# Patient Record
Sex: Male | Born: 1975 | Race: White | Hispanic: No | Marital: Married | Smoking: Former smoker
Health system: Southern US, Community
[De-identification: ages and names within clinical notes are randomized; demographics above are authoritative.]

## PROBLEM LIST (undated history)

## (undated) DIAGNOSIS — F909 Attention-deficit hyperactivity disorder, unspecified type: Secondary | ICD-10-CM

## (undated) DIAGNOSIS — M549 Dorsalgia, unspecified: Secondary | ICD-10-CM

## (undated) DIAGNOSIS — B2790 Infectious mononucleosis, unspecified without complication: Secondary | ICD-10-CM

---

## 2005-02-17 ENCOUNTER — Emergency Department (HOSPITAL_COMMUNITY): Admission: EM | Admit: 2005-02-17 | Discharge: 2005-02-18 | Payer: Self-pay | Admitting: Emergency Medicine

## 2011-06-08 ENCOUNTER — Emergency Department (HOSPITAL_BASED_OUTPATIENT_CLINIC_OR_DEPARTMENT_OTHER)
Admission: EM | Admit: 2011-06-08 | Discharge: 2011-06-08 | Disposition: A | Discharge status: Home / Self Care | Payer: Commercial Managed Care - PPO | Attending: Emergency Medicine | Admitting: Emergency Medicine

## 2011-06-08 ENCOUNTER — Emergency Department (INDEPENDENT_AMBULATORY_CARE_PROVIDER_SITE_OTHER): Payer: Commercial Managed Care - PPO

## 2011-06-08 ENCOUNTER — Encounter: Payer: Self-pay | Admitting: *Deleted

## 2011-06-08 DIAGNOSIS — R062 Wheezing: Secondary | ICD-10-CM

## 2011-06-08 DIAGNOSIS — R0602 Shortness of breath: Secondary | ICD-10-CM

## 2011-06-08 DIAGNOSIS — R059 Cough, unspecified: Secondary | ICD-10-CM | POA: Insufficient documentation

## 2011-06-08 DIAGNOSIS — J111 Influenza due to unidentified influenza virus with other respiratory manifestations: Secondary | ICD-10-CM | POA: Insufficient documentation

## 2011-06-08 DIAGNOSIS — R05 Cough: Secondary | ICD-10-CM

## 2011-06-08 DIAGNOSIS — J029 Acute pharyngitis, unspecified: Secondary | ICD-10-CM

## 2011-06-08 DIAGNOSIS — J9801 Acute bronchospasm: Secondary | ICD-10-CM

## 2011-06-08 LAB — URINALYSIS, ROUTINE W REFLEX MICROSCOPIC
Bilirubin Urine: NEGATIVE
Glucose, UA: NEGATIVE mg/dL
Ketones, ur: NEGATIVE mg/dL
Leukocytes, UA: NEGATIVE
Specific Gravity, Urine: 1.007 (ref 1.005–1.030)
pH: 6.5 (ref 5.0–8.0)

## 2011-06-08 LAB — DIFFERENTIAL
Basophils Absolute: 0 10*3/uL (ref 0.0–0.1)
Basophils Relative: 0 % (ref 0–1)
Eosinophils Absolute: 0 10*3/uL (ref 0.0–0.7)
Neutro Abs: 6.7 10*3/uL (ref 1.7–7.7)
Neutrophils Relative %: 79 % — ABNORMAL HIGH (ref 43–77)

## 2011-06-08 LAB — COMPREHENSIVE METABOLIC PANEL
AST: 25 U/L (ref 0–37)
Albumin: 4 g/dL (ref 3.5–5.2)
Alkaline Phosphatase: 63 U/L (ref 39–117)
Chloride: 102 mEq/L (ref 96–112)
Potassium: 3.5 mEq/L (ref 3.5–5.1)
Sodium: 137 mEq/L (ref 135–145)
Total Bilirubin: 0.9 mg/dL (ref 0.3–1.2)
Total Protein: 7.5 g/dL (ref 6.0–8.3)

## 2011-06-08 LAB — CBC
Hemoglobin: 14.2 g/dL (ref 13.0–17.0)
MCHC: 36.7 g/dL — ABNORMAL HIGH (ref 30.0–36.0)
Platelets: 181 10*3/uL (ref 150–400)
RDW: 12.9 % (ref 11.5–15.5)

## 2011-06-08 MED ORDER — SODIUM CHLORIDE 0.9 % IV BOLUS (SEPSIS)
2000.0000 mL | Freq: Once | INTRAVENOUS | Status: AC
  Administered 2011-06-08: 2000 mL via INTRAVENOUS

## 2011-06-08 MED ORDER — ALBUTEROL SULFATE HFA 108 (90 BASE) MCG/ACT IN AERS: 2.0000 | INHALATION_SPRAY | RESPIRATORY_TRACT | Status: DC | PRN

## 2011-06-08 MED ORDER — PREDNISONE 20 MG PO TABS: ORAL_TABLET | ORAL | Status: AC

## 2011-06-08 MED ORDER — PREDNISONE 50 MG PO TABS
60.0000 mg | ORAL_TABLET | Freq: Once | ORAL | Status: AC
  Administered 2011-06-08: 60 mg via ORAL
  Filled 2011-06-08: qty 1

## 2011-06-08 MED ORDER — ALBUTEROL SULFATE (5 MG/ML) 0.5% IN NEBU
5.0000 mg | INHALATION_SOLUTION | Freq: Once | RESPIRATORY_TRACT | Status: AC
  Administered 2011-06-08: 5 mg via RESPIRATORY_TRACT
  Filled 2011-06-08: qty 1

## 2011-06-08 MED ORDER — IPRATROPIUM BROMIDE 0.02 % IN SOLN
0.5000 mg | Freq: Once | RESPIRATORY_TRACT | Status: AC
  Administered 2011-06-08: 0.5 mg via RESPIRATORY_TRACT
  Filled 2011-06-08: qty 2.5

## 2011-06-08 MED ORDER — SODIUM CHLORIDE 0.9 % IV SOLN: INTRAVENOUS | Status: DC

## 2011-06-08 NOTE — ED Notes (Signed)
Pt states he has had flu-like s/s since Wed. (fever, cough, body soreness, weakness, diarrhea and bloody mucous since last Wed.) No relief with OTC meds.

## 2011-06-08 NOTE — ED Provider Notes (Signed)
History  Scribed for Hurman Horn, MD, the patient was seen in room MH10/MH10. This chart was scribed by Candelaria Stagers. The patient's care started at 4:26PM.     CSN: [id number] Arrival date & time: 06/08/2011  4:15 PM   First MD Initiated Contact with Patient 06/08/11 1624      Chief Complaint  Patient presents with  . Influenza     The history is provided by the patient.   Hector Williamson is a 35 y.o. male who presents to the Emergency Department complaining of influenza sx that began about four days ago.  Pt is experiencing fever, cough, body chills, generalized weakness, headache, SOB, and loss of appetite.  He denies nausea, vomiting, rash, and confusion.  He has taken OTC medications for sx with no relief.  He has no h/o asthma.    History reviewed. No pertinent past medical history.  History reviewed. No pertinent past surgical history.  History reviewed. No pertinent family history.  History  Substance Use Topics  . Smoking status: Never Smoker   . Smokeless tobacco: Not on file  . Alcohol Use: Yes      Review of Systems  Constitutional: Positive for fever, chills, activity change and appetite change.       10 Systems reviewed and are negative for acute change except as noted in the HPI.  HENT: Negative for congestion.   Eyes: Negative for discharge and redness.  Respiratory: Positive for cough and shortness of breath.   Cardiovascular: Negative for chest pain.  Gastrointestinal: Negative for nausea, vomiting and abdominal pain.  Musculoskeletal: Negative for back pain.  Skin: Negative for rash.  Neurological: Positive for weakness. Negative for syncope, numbness and headaches.  Psychiatric/Behavioral: Negative for confusion.       No behavior change.    Allergies  Penicillins  Home Medications   Current Outpatient Rx  Name Route Sig Dispense Refill  . ACETAMINOPHEN 500 MG PO TABS Oral Take 1,000 mg by mouth every 6 (six) hours as needed. For pain      . DM-DOXYLAMINE-ACETAMINOPHEN 15-6.25-325 MG PO CAPS Oral Take 2 capsules by mouth at bedtime as needed. For cold and flu symptoms     . DM-PHENYLEPHRINE-ACETAMINOPHEN 10-5-325 MG PO CAPS Oral Take 2 capsules by mouth 2 (two) times daily as needed. For cold and flu symptoms      . ALBUTEROL SULFATE HFA 108 (90 BASE) MCG/ACT IN AERS Inhalation Inhale 2 puffs into the lungs every 2 (two) hours as needed for wheezing or shortness of breath (cough). 1 Inhaler 0  . PREDNISONE 20 MG PO TABS  2 tabs po daily x 4 days 8 tablet 0    BP 115/73  Pulse 112  Temp(Src) 100.4 F (38 C) (Oral)  Resp 20  Ht 6' (1.829 m)  Wt 185 lb (83.915 kg)  BMI 25.09 kg/m2  SpO2 100%  Physical Exam  Nursing note and vitals reviewed. Constitutional: He is oriented to person, place, and time. He appears well-developed and well-nourished. No distress.       Awake, alert, nontoxic appearance.  HENT:  Head: Atraumatic.  Right Ear: Tympanic membrane and external ear normal.  Left Ear: Tympanic membrane and external ear normal.  Mouth/Throat: Oropharyngeal exudate present.       Tonsillar mildly enlarged, with scattered exudates, mildly erythemas.   Oropharynx somewhat dry.   Eyes: Conjunctivae are normal. Pupils are equal, round, and reactive to light. Right eye exhibits no discharge. Left eye exhibits no discharge.  Neck: Normal range of motion. Neck supple.  Cardiovascular: Regular rhythm.  Exam reveals no gallop and no friction rub.   No murmur heard.      Tachycardic    Pulmonary/Chest: Effort normal. He has wheezes (mild expiratory wheezes). He has no rales. He exhibits tenderness.       Scattered rhonchi bilaterally.    Abdominal: Soft. He exhibits no mass. There is tenderness (mild difuse tenderness). There is no rebound.       No HSM palpated.   Musculoskeletal: Normal range of motion. He exhibits tenderness. Edema: diffusly tender arms, legs, and back.        Baseline ROM, no obvious new focal  weakness.  Neurological: He is alert and oriented to person, place, and time.       Mental status and motor strength appears baseline for patient and situation.  Skin: Skin is warm and dry. No rash noted.  Psychiatric: He has a normal mood and affect. His behavior is normal.    ED Course  Procedures   DIAGNOSTIC STUDIES: Oxygen Saturation is 100% on room air, normal by my interpretation.    COORDINATION OF CARE:  4:38PM Ordered: Urinalysis, Routine w reflex microscopic ; Rapid strep screen ; DG Chest 2 View ; albuterol (PROVENTIL) (5 MG/ML) 0.5% nebulizer solution 5 mg ; ipratropium (ATROVENT) nebulizer solution 0.5 mg ; predniSONE (DELTASONE) tablet 60 mg ; sodium chloride 0.9 % bolus 2,000 mL ; 0.9 % sodium chloride infusion ; Pulse oximetry, continuous  6:42PM Recheck: Discussed course of care with pt. the patient feels much better after IV fluids in the emergency department and feels ready for home.  Labs Reviewed  CBC - Abnormal; Notable for the following:    HCT 38.7 (*)    MCHC 36.7 (*)    All other components within normal limits  DIFFERENTIAL - Abnormal; Notable for the following:    Neutrophils Relative 79 (*)    All other components within normal limits  COMPREHENSIVE METABOLIC PANEL - Abnormal; Notable for the following:    Glucose, Bld 130 (*)    All other components within normal limits  LIPASE, BLOOD  URINALYSIS, ROUTINE W REFLEX MICROSCOPIC  RAPID STREP SCREEN   Dg Chest 2 View  06/08/2011  *RADIOLOGY REPORT*  Clinical Data: Cough, fever, wheezing.  Sore throat, shortness of breath.  Positive strep.  CHEST - 2 VIEW  Comparison: None.  Findings: The film is made with shallow lung inflation.  Heart size is normal.  There is perihilar peribronchial thickening.  There are no focal consolidations or pleural effusions.  No evidence for pulmonary edema.  IMPRESSION: Changes consistent with viral airways disease.  Original Report Authenticated By: Patterson Hammersmith, M.D.       1. Influenza   2. Bronchospasm       MDM  I doubt any other EMC precluding discharge at this time including, but not necessarily limited to the following:SBI.  I personally performed the services described in this documentation, which was scribed in my presence. The recorded information has been reviewed and considered.         Hurman Horn, MD 06/08/11 2232

## 2014-11-16 ENCOUNTER — Inpatient Hospital Stay (HOSPITAL_COMMUNITY)
Admission: EM | Admit: 2014-11-16 | Discharge: 2014-11-19 | DRG: 872 | Disposition: A | Discharge status: Home / Self Care | Payer: BLUE CROSS/BLUE SHIELD | Attending: Internal Medicine | Admitting: Internal Medicine

## 2014-11-16 ENCOUNTER — Emergency Department (HOSPITAL_COMMUNITY): Payer: BLUE CROSS/BLUE SHIELD

## 2014-11-16 ENCOUNTER — Encounter (HOSPITAL_COMMUNITY): Payer: Self-pay | Admitting: Emergency Medicine

## 2014-11-16 DIAGNOSIS — M549 Dorsalgia, unspecified: Secondary | ICD-10-CM | POA: Diagnosis not present

## 2014-11-16 DIAGNOSIS — R6889 Other general symptoms and signs: Secondary | ICD-10-CM

## 2014-11-16 DIAGNOSIS — G8929 Other chronic pain: Secondary | ICD-10-CM | POA: Diagnosis present

## 2014-11-16 DIAGNOSIS — Z87891 Personal history of nicotine dependence: Secondary | ICD-10-CM

## 2014-11-16 DIAGNOSIS — R109 Unspecified abdominal pain: Secondary | ICD-10-CM

## 2014-11-16 DIAGNOSIS — E872 Acidosis: Secondary | ICD-10-CM | POA: Diagnosis present

## 2014-11-16 DIAGNOSIS — G039 Meningitis, unspecified: Secondary | ICD-10-CM | POA: Diagnosis present

## 2014-11-16 DIAGNOSIS — A419 Sepsis, unspecified organism: Principal | ICD-10-CM | POA: Diagnosis present

## 2014-11-16 DIAGNOSIS — F909 Attention-deficit hyperactivity disorder, unspecified type: Secondary | ICD-10-CM | POA: Diagnosis present

## 2014-11-16 DIAGNOSIS — E860 Dehydration: Secondary | ICD-10-CM | POA: Diagnosis present

## 2014-11-16 DIAGNOSIS — R29818 Other symptoms and signs involving the nervous system: Secondary | ICD-10-CM | POA: Diagnosis present

## 2014-11-16 DIAGNOSIS — R52 Pain, unspecified: Secondary | ICD-10-CM

## 2014-11-16 DIAGNOSIS — J069 Acute upper respiratory infection, unspecified: Secondary | ICD-10-CM | POA: Diagnosis present

## 2014-11-16 DIAGNOSIS — B349 Viral infection, unspecified: Secondary | ICD-10-CM | POA: Diagnosis present

## 2014-11-16 DIAGNOSIS — R509 Fever, unspecified: Secondary | ICD-10-CM | POA: Insufficient documentation

## 2014-11-16 DIAGNOSIS — M546 Pain in thoracic spine: Secondary | ICD-10-CM

## 2014-11-16 DIAGNOSIS — G0491 Myelitis, unspecified: Secondary | ICD-10-CM | POA: Diagnosis present

## 2014-11-16 DIAGNOSIS — Z79899 Other long term (current) drug therapy: Secondary | ICD-10-CM

## 2014-11-16 HISTORY — DX: Dorsalgia, unspecified: M54.9

## 2014-11-16 LAB — I-STAT CG4 LACTIC ACID, ED: LACTIC ACID, VENOUS: 3.7 mmol/L — AB (ref 0.5–2.0)

## 2014-11-16 LAB — COMPREHENSIVE METABOLIC PANEL
ALK PHOS: 60 U/L (ref 38–126)
ALT: 30 U/L (ref 17–63)
ANION GAP: 12 (ref 5–15)
AST: 33 U/L (ref 15–41)
Albumin: 4.5 g/dL (ref 3.5–5.0)
BILIRUBIN TOTAL: 1.6 mg/dL — AB (ref 0.3–1.2)
BUN: 20 mg/dL (ref 6–20)
CALCIUM: 9.6 mg/dL (ref 8.9–10.3)
CO2: 23 mmol/L (ref 22–32)
CREATININE: 1.16 mg/dL (ref 0.61–1.24)
Chloride: 102 mmol/L (ref 101–111)
GFR calc Af Amer: >60 mL/min (ref >60)
GFR calc non Af Amer: >60 mL/min (ref >60)
GLUCOSE: 102 mg/dL — AB (ref 65–99)
POTASSIUM: 4 mmol/L (ref 3.5–5.1)
SODIUM: 137 mmol/L (ref 135–145)
TOTAL PROTEIN: 7.6 g/dL (ref 6.5–8.1)

## 2014-11-16 LAB — CBC WITH DIFFERENTIAL/PLATELET
BASOS PCT: 0 % (ref 0–1)
Basophils Absolute: 0 10*3/uL (ref 0.0–0.1)
EOS ABS: 0 10*3/uL (ref 0.0–0.7)
EOS PCT: 0 % (ref 0–5)
HEMATOCRIT: 40.2 % (ref 39.0–52.0)
Hemoglobin: 14.2 g/dL (ref 13.0–17.0)
LYMPHS ABS: 0.6 10*3/uL — AB (ref 0.7–4.0)
LYMPHS PCT: 5 % — AB (ref 12–46)
MCH: 30.8 pg (ref 26.0–34.0)
MCHC: 35.3 g/dL (ref 30.0–36.0)
MCV: 87.2 fL (ref 78.0–100.0)
Monocytes Absolute: 0.9 10*3/uL (ref 0.1–1.0)
Monocytes Relative: 7 % (ref 3–12)
NEUTROS ABS: 10.7 10*3/uL — AB (ref 1.7–7.7)
NEUTROS PCT: 88 % — AB (ref 43–77)
PLATELETS: 189 10*3/uL (ref 150–400)
RBC: 4.61 MIL/uL (ref 4.22–5.81)
RDW: 13.1 % (ref 11.5–15.5)
WBC: 12.2 10*3/uL — AB (ref 4.0–10.5)

## 2014-11-16 LAB — GLUCOSE, CSF: Glucose, CSF: 60 mg/dL (ref 40–70)

## 2014-11-16 LAB — GRAM STAIN

## 2014-11-16 LAB — URINALYSIS, ROUTINE W REFLEX MICROSCOPIC
BILIRUBIN URINE: NEGATIVE
GLUCOSE, UA: NEGATIVE mg/dL
HGB URINE DIPSTICK: NEGATIVE
KETONES UR: NEGATIVE mg/dL
Leukocytes, UA: NEGATIVE
NITRITE: NEGATIVE
Protein, ur: NEGATIVE mg/dL
SPECIFIC GRAVITY, URINE: 1.023 (ref 1.005–1.030)
Urobilinogen, UA: 0.2 mg/dL (ref 0.0–1.0)
pH: 8 (ref 5.0–8.0)

## 2014-11-16 LAB — CSF CELL COUNT WITH DIFFERENTIAL
RBC Count, CSF: 1 /mm3 — ABNORMAL HIGH
RBC Count, CSF: 172 /mm3 — ABNORMAL HIGH
TUBE #: 4
Tube #: 1
WBC CSF: 1 /mm3 (ref 0–5)
WBC, CSF: 2 /mm3 (ref 0–5)

## 2014-11-16 LAB — PROTEIN, CSF: TOTAL PROTEIN, CSF: 24 mg/dL (ref 15–45)

## 2014-11-16 MED ORDER — FENTANYL CITRATE (PF) 100 MCG/2ML IJ SOLN
100.0000 ug | INTRAMUSCULAR | Status: AC | PRN
  Administered 2014-11-16 (×4): 100 ug via INTRAVENOUS
  Filled 2014-11-16 (×4): qty 2

## 2014-11-16 MED ORDER — ACETAMINOPHEN 325 MG PO TABS
650.0000 mg | ORAL_TABLET | Freq: Once | ORAL | Status: AC
  Administered 2014-11-16: 650 mg via ORAL
  Filled 2014-11-16: qty 2

## 2014-11-16 MED ORDER — ONDANSETRON HCL 4 MG/2ML IJ SOLN
4.0000 mg | Freq: Once | INTRAMUSCULAR | Status: AC
  Administered 2014-11-16: 4 mg via INTRAVENOUS
  Filled 2014-11-16: qty 2

## 2014-11-16 MED ORDER — DEXTROSE 5 % IV SOLN
2.0000 g | Freq: Once | INTRAVENOUS | Status: AC
  Administered 2014-11-16: 2 g via INTRAVENOUS
  Filled 2014-11-16: qty 2

## 2014-11-16 MED ORDER — MEPERIDINE HCL 50 MG/ML IJ SOLN
25.0000 mg | Freq: Once | INTRAMUSCULAR | Status: AC
Start: 2014-11-17 — End: 2014-11-17
  Administered 2014-11-17: 25 mg via INTRAVENOUS

## 2014-11-16 MED ORDER — SODIUM CHLORIDE 0.9 % IV BOLUS (SEPSIS)
2500.0000 mL | Freq: Once | INTRAVENOUS | Status: AC
  Administered 2014-11-16: 2500 mL via INTRAVENOUS

## 2014-11-16 NOTE — ED Notes (Signed)
Lumbar puncture tray at bedside. °

## 2014-11-16 NOTE — ED Provider Notes (Signed)
2115:  I was asked by Dr. Effie ShyWentz to perform a lumbar puncture on the patient.  LUMBAR PUNCTURE Date/Time: 11/16/2014 10:06 PM Performed by: Roxy HorsemanBROWNING, Peyten Punches Authorized by: Mancel BaleWENTZ, ELLIOTT Consent: The procedure was performed in an emergent situation. Verbal consent obtained. Written consent obtained. Risks and benefits: risks, benefits and alternatives were discussed Consent given by: patient and spouse Patient understanding: patient states understanding of the procedure being performed Patient consent: the patient's understanding of the procedure matches consent given Procedure consent: procedure consent matches procedure scheduled Relevant documents: relevant documents present and verified Test results: test results available and properly labeled Site marked: the operative site was marked Imaging studies: imaging studies available Required items: required blood products, implants, devices, and special equipment available Patient identity confirmed: verbally with patient, arm band, provided demographic data and hospital-assigned identification number Time out: Immediately prior to procedure a "time out" was called to verify the correct patient, procedure, equipment, support staff and site/side marked as required. Indications: evaluation for infection Anesthesia: local infiltration Local anesthetic: lidocaine 1% without epinephrine Anesthetic total: 5 ml Patient sedated: no Preparation: Patient was prepped and draped in the usual sterile fashion. Lumbar space: L4-L5 interspace Patient's position: sitting Needle gauge: 18 Needle type: spinal needle - Quincke tip Needle length: 3.5 in Number of attempts: 1 Fluid appearance: clear Tubes of fluid: 4 Total volume: 2 ml Post-procedure: site cleaned, pressure dressing applied and adhesive bandage applied Patient tolerance: Patient tolerated the procedure well with no immediate complications Comments: Lumbar puncture was successful after 1st  attempt, patient tolerated the procedure well with no immediate complications.  CSF was clear in all tubes.  I suspect that there will be some RBCs in initial tube 2/2 to my entering the skin near the anesthetized region which still had scant amount of bleeding after being wiped clean.  Roxy Horsemanobert Johnnie Moten, PA-C 11/16/14 2211  Mancel BaleElliott Wentz, MD 11/16/14 2325

## 2014-11-16 NOTE — ED Notes (Signed)
[facility] presents with a 39 yo male from home with back pain/spasms that started 1 hour ago per patient.  No injury sustained; pt has hx of back spasms but stated not this severe.  Pt has 10 out of 10 pain at this time.  Pt was anxious with [facility] breathing at approximately 60 bpm and tachycardic in the 110 to 120  bpm range.  Patient is significantly more calm at this time but remains tachycardic at 119 bpm.

## 2014-11-16 NOTE — ED Notes (Signed)
Bed: WA21 Expected date:  Expected time:  Means of arrival:  Comments: EMS  

## 2014-11-16 NOTE — ED Notes (Signed)
Dr. Effie ShyWentz notified of critical [place].

## 2014-11-16 NOTE — ED Provider Notes (Signed)
CSN: [id number]     Arrival date & time 11/16/14  1927 History   First MD Initiated Contact with Patient 11/16/14 1943     Chief Complaint  Patient presents with  . Back Pain     (Consider location/radiation/quality/duration/timing/severity/associated sxs/prior Treatment) HPI   Orson ApeDavid R Mohamad is a 39 y.o. male who presents for evaluation of upper back pain and shaking. Symptoms started several hours ago. No known trauma. He has ongoing chronic back pain for which she sees a chiropractor couple of times a month. No recent manipulations. He took ibuprofen for fever today. No known sick contacts. No similar problem in the past. There are no other known modifying factors.   Past Medical History  Diagnosis Date  . Back pain     spasms   History reviewed. No pertinent past surgical history. History reviewed. No pertinent family history. History  Substance Use Topics  . Smoking status: Former Smoker    Types: Cigarettes    Quit date: 11/16/1999  . Smokeless tobacco: Former NeurosurgeonUser    Quit date: 11/15/1997  . Alcohol Use: Yes     Comment: occassionally    Review of Systems  All other systems reviewed and are negative.     Allergies  Penicillins  Home Medications   Prior to Admission medications   Medication Sig Start Date End Date Taking? Authorizing Provider  ibuprofen (ADVIL,MOTRIN) 200 MG tablet Take 400 mg by mouth every 6 (six) hours as needed for fever or moderate pain.   Yes Historical Provider, MD  lisdexamfetamine (VYVANSE) 40 MG capsule Take 40 mg by mouth every morning.   Yes Historical Provider, MD  albuterol (PROVENTIL HFA;VENTOLIN HFA) 108 (90 BASE) MCG/ACT inhaler Inhale 2 puffs into the lungs every 2 (two) hours as needed for wheezing or shortness of breath (cough). 06/08/11 06/07/12  Wayland SalinasJohn Bednar, MD   BP 107/60 mmHg  Pulse 119  Temp(Src) 102.7 F (39.3 C) (Rectal)  Resp 25  Wt 195 lb (88.451 kg)  SpO2 98% Physical Exam  Constitutional: He is oriented  to person, place, and time. He appears well-developed and well-nourished. He appears distressed (He is uncomfortable.).  HENT:  Head: Normocephalic and atraumatic.  Right Ear: External ear normal.  Left Ear: External ear normal.  Eyes: Conjunctivae and EOM are normal. Pupils are equal, round, and reactive to light.  Neck: Normal range of motion and phonation normal. Neck supple.  Mild lower neck pain with passive flexion.  Cardiovascular: Normal rate, regular rhythm and normal heart sounds.   Pulmonary/Chest: Effort normal and breath sounds normal. No respiratory distress. He exhibits no bony tenderness.  Abdominal: Soft. There is no tenderness.  Musculoskeletal:  Mild mid-lower lumbar spine tenderness. No associated deformity.  Neurological: He is alert and oriented to person, place, and time. No cranial nerve deficit or sensory deficit. He exhibits normal muscle tone. Coordination normal.  Skin: Skin is warm, dry and intact. No rash noted.  Psychiatric: He has a normal mood and affect. His behavior is normal. Judgment and thought content normal.  Nursing note and vitals reviewed.   ED Course  Procedures (including critical care time)  Initial concern for meningitis versus intraspinal infection. No recent instrumentation, or preceding illness, concerning for hematogenous spread. Therefore, he'll be first evaluated for meningitis. Empiric Rocephin ordered.  Medications  meperidine (DEMEROL) injection 25 mg (not administered)  sodium chloride 0.9 % bolus 2,500 mL (0 mLs Intravenous Stopped 11/16/14 2227)  fentaNYL (SUBLIMAZE) injection 100 mcg (100 mcg Intravenous Given  11/16/14 2326)  ondansetron (ZOFRAN) injection 4 mg (4 mg Intravenous Given 11/16/14 2052)  cefTRIAXone (ROCEPHIN) 2 g in dextrose 5 % 50 mL IVPB (0 g Intravenous Stopped 11/16/14 2200)  acetaminophen (TYLENOL) tablet 650 mg (650 mg Oral Given 11/16/14 2050)    Patient Vitals for the past 24 hrs:  BP Temp Temp src Pulse  Resp SpO2 Weight  11/16/14 2315 107/60 mmHg - - 119 25 98 % -  11/16/14 2314 - 102.7 F (39.3 C) Rectal - - - -  11/16/14 2300 (!) 106/46 mmHg - - 103 18 95 % -  11/16/14 2245 (!) 101/45 mmHg - - 106 14 92 % -  11/16/14 2230 (!) 100/43 mmHg - - 109 21 98 % -  11/16/14 2215 (!) 112/51 mmHg - - 110 19 97 % -  11/16/14 2207 - (!) 103.1 F (39.5 C) Rectal - - - -  11/16/14 2200 (!) 110/53 mmHg - - 110 21 92 % -  11/16/14 2130 108/75 mmHg - - (!) 122 25 99 % -  11/16/14 2034 - - - - - - 195 lb (88.451 kg)  11/16/14 2033 - (!) 105 F (40.6 C) Rectal - - - -  11/16/14 2000 (!) 118/53 mmHg - - 113 19 96 % -  11/16/14 1930 115/63 mmHg - - 117 21 100 % -  11/16/14 1927 103/71 mmHg 98.9 F (37.2 C) Oral (!) 121 (!) 30 100 % -    11:56 PM Reevaluation with update and discussion. After initial assessment and treatment, an updated evaluation reveals he is still uncomfortable, with back pain, and now riders. He has not improved with 4 doses of fentanyl. His temperature is somewhat better. Findings discussed with patient and family members updated on treatment plan. They agreed to transfer to  for MR imaging of the spine. Varie Machamer L    ED Sepsis - Repeat Assessment   Performed at:    Nov 16, 2014, 11:30 PM   Last Vitals:    Blood pressure 113/54, pulse 61, temperature 102.7 F, temperature source Oral, resp. rate 16, height 6' (1.829 m), weight 190 lb 0.6 oz (86.2 kg), SpO2 98 %.  Heart:      tachycardic  Lungs:     clear  Capillary Refill:   normal  Peripheral Pulse (include location): Right wrist   Skin (include color):   Normal, pink   CRITICAL CARE Performed by: Mancel Bale L Total critical care time: 45 minutes Critical care time was exclusive of separately billable procedures and treating other patients. Critical care was necessary to treat or prevent imminent or life-threatening deterioration. Critical care was time spent personally by me on the following  activities: development of treatment plan with patient and/or surrogate as well as nursing, discussions with consultants, evaluation of patient's response to treatment, examination of patient, obtaining history from patient or surrogate, ordering and performing treatments and interventions, ordering and review of laboratory studies, ordering and review of radiographic studies, pulse oximetry and re-evaluation of patient's condition.  Labs Review Labs Reviewed  CBC WITH DIFFERENTIAL/PLATELET - Abnormal; Notable for the following:    WBC 12.2 (*)    Neutrophils Relative % 88 (*)    Neutro Abs 10.7 (*)    Lymphocytes Relative 5 (*)    Lymphs Abs 0.6 (*)    All other components within normal limits  COMPREHENSIVE METABOLIC PANEL - Abnormal; Notable for the following:    Glucose, Bld 102 (*)    Total Bilirubin 1.6 (*)  All other components within normal limits  CSF CELL COUNT WITH DIFFERENTIAL - Abnormal; Notable for the following:    Appearance, CSF CLEAR (*)    RBC Count, CSF 172 (*)    All other components within normal limits  CSF CELL COUNT WITH DIFFERENTIAL - Abnormal; Notable for the following:    Appearance, CSF CLEAR (*)    RBC Count, CSF 1 (*)    All other components within normal limits  I-STAT CG4 LACTIC ACID, ED - Abnormal; Notable for the following:    Lactic Acid, Venous 3.70 (*)    All other components within normal limits  GRAM STAIN  CULTURE, BLOOD (ROUTINE X 2)  CULTURE, BLOOD (ROUTINE X 2)  CSF CULTURE  URINALYSIS, ROUTINE W REFLEX MICROSCOPIC (NOT AT [facility])  GLUCOSE, CSF  PROTEIN, CSF  I-STAT CG4 LACTIC ACID, ED    Imaging Review Dg Chest 1 View  11/16/2014   CLINICAL DATA:  Fever and shortness of breath  EXAM: CHEST  1 VIEW  COMPARISON:  June 08, 2011  FINDINGS: There is no edema or consolidation. Heart size and pulmonary vascularity are normal. No adenopathy. No bone lesions.  IMPRESSION: No edema or consolidation.   Electronically Signed   By: Bretta Bang III M.D.   On: 11/16/2014 21:19   Ct Head Wo Contrast  11/16/2014   CLINICAL DATA:  Initial evaluation for acute headache, weakness.  EXAM: CT HEAD WITHOUT CONTRAST  TECHNIQUE: Contiguous axial images were obtained from the base of the skull through the vertex without intravenous contrast.  COMPARISON:  None.  FINDINGS: There is no acute intracranial hemorrhage or infarct. No mass lesion or midline shift. Gray-white matter differentiation is well maintained. Ventricles are normal in size without evidence of hydrocephalus. CSF containing spaces are within normal limits. No extra-axial fluid collection.  The calvarium is intact.  Orbital soft tissues are within normal limits.  The paranasal sinuses and mastoid air cells are well pneumatized and free of fluid.  Scalp soft tissues are unremarkable.  IMPRESSION: Normal head CT with no acute intracranial process identified.   Electronically Signed   By: Rise Mu M.D.   On: 11/16/2014 21:28     EKG Interpretation None      MDM   Final diagnoses:  Febrile illness  Thoracic back pain, unspecified back pain laterality  Rigors  Back pain    Back pain, neck pain and headache, without evidence for meningitis. Patient requires additional imaging, with MR, and therefore has to be transferred to [facility]. He'll need to be admitted after that for further treatment.  Nursing Notes Reviewed/ Care Coordinated, and agree without changes. Applicable Imaging Reviewed.  Interpretation of Laboratory Data incorporated into ED treatment  Plan: MR imaging urgently, followed by hospital admission   Mancel Bale, MD 11/18/14 1525

## 2014-11-17 ENCOUNTER — Inpatient Hospital Stay (HOSPITAL_COMMUNITY): Payer: BLUE CROSS/BLUE SHIELD

## 2014-11-17 ENCOUNTER — Inpatient Hospital Stay (HOSPITAL_COMMUNITY)
Admit: 2014-11-17 | Discharge: 2014-11-17 | Disposition: A | Discharge status: Home / Self Care | Payer: BLUE CROSS/BLUE SHIELD | Attending: Emergency Medicine | Admitting: Emergency Medicine

## 2014-11-17 DIAGNOSIS — E872 Acidosis: Secondary | ICD-10-CM | POA: Diagnosis present

## 2014-11-17 DIAGNOSIS — M549 Dorsalgia, unspecified: Secondary | ICD-10-CM | POA: Diagnosis present

## 2014-11-17 DIAGNOSIS — Z87891 Personal history of nicotine dependence: Secondary | ICD-10-CM | POA: Diagnosis not present

## 2014-11-17 DIAGNOSIS — M546 Pain in thoracic spine: Secondary | ICD-10-CM

## 2014-11-17 DIAGNOSIS — B349 Viral infection, unspecified: Secondary | ICD-10-CM | POA: Diagnosis present

## 2014-11-17 DIAGNOSIS — R29818 Other symptoms and signs involving the nervous system: Secondary | ICD-10-CM | POA: Diagnosis not present

## 2014-11-17 DIAGNOSIS — A419 Sepsis, unspecified organism: Secondary | ICD-10-CM | POA: Diagnosis present

## 2014-11-17 DIAGNOSIS — F909 Attention-deficit hyperactivity disorder, unspecified type: Secondary | ICD-10-CM | POA: Diagnosis present

## 2014-11-17 DIAGNOSIS — G8929 Other chronic pain: Secondary | ICD-10-CM | POA: Diagnosis present

## 2014-11-17 DIAGNOSIS — R509 Fever, unspecified: Secondary | ICD-10-CM | POA: Diagnosis not present

## 2014-11-17 DIAGNOSIS — G0491 Myelitis, unspecified: Secondary | ICD-10-CM | POA: Diagnosis not present

## 2014-11-17 DIAGNOSIS — G039 Meningitis, unspecified: Secondary | ICD-10-CM

## 2014-11-17 DIAGNOSIS — Z79899 Other long term (current) drug therapy: Secondary | ICD-10-CM | POA: Diagnosis not present

## 2014-11-17 DIAGNOSIS — J069 Acute upper respiratory infection, unspecified: Secondary | ICD-10-CM | POA: Diagnosis present

## 2014-11-17 DIAGNOSIS — E860 Dehydration: Secondary | ICD-10-CM | POA: Diagnosis present

## 2014-11-17 LAB — I-STAT CG4 LACTIC ACID, ED: Lactic Acid, Venous: 3.78 mmol/L (ref 0.5–2.0)

## 2014-11-17 LAB — FOLATE: Folate: 10.3 ng/mL (ref >5.9)

## 2014-11-17 LAB — LIPASE, BLOOD: Lipase: 15 U/L — ABNORMAL LOW (ref 22–51)

## 2014-11-17 LAB — COMPREHENSIVE METABOLIC PANEL
ALBUMIN: 3.7 g/dL (ref 3.5–5.0)
ALK PHOS: 50 U/L (ref 38–126)
ALT: 26 U/L (ref 17–63)
AST: 21 U/L (ref 15–41)
Anion gap: 7 (ref 5–15)
BUN: 15 mg/dL (ref 6–20)
CO2: 24 mmol/L (ref 22–32)
Calcium: 8 mg/dL — ABNORMAL LOW (ref 8.9–10.3)
Chloride: 103 mmol/L (ref 101–111)
Creatinine, Ser: 1.01 mg/dL (ref 0.61–1.24)
GFR calc Af Amer: >60 mL/min (ref >60)
GLUCOSE: 117 mg/dL — AB (ref 65–99)
Potassium: 3.5 mmol/L (ref 3.5–5.1)
SODIUM: 134 mmol/L — AB (ref 135–145)
Total Bilirubin: 1.3 mg/dL — ABNORMAL HIGH (ref 0.3–1.2)
Total Protein: 6.3 g/dL — ABNORMAL LOW (ref 6.5–8.1)

## 2014-11-17 LAB — INFLUENZA PANEL BY PCR (TYPE A & B)
H1N1FLUPCR: NOT DETECTED
Influenza A By PCR: NEGATIVE
Influenza B By PCR: NEGATIVE

## 2014-11-17 LAB — CBC WITH DIFFERENTIAL/PLATELET
BASOS ABS: 0 10*3/uL (ref 0.0–0.1)
Basophils Relative: 0 % (ref 0–1)
EOS ABS: 0 10*3/uL (ref 0.0–0.7)
Eosinophils Relative: 0 % (ref 0–5)
HCT: 35.5 % — ABNORMAL LOW (ref 39.0–52.0)
Hemoglobin: 12.4 g/dL — ABNORMAL LOW (ref 13.0–17.0)
Lymphocytes Relative: 6 % — ABNORMAL LOW (ref 12–46)
Lymphs Abs: 0.8 10*3/uL (ref 0.7–4.0)
MCH: 30.8 pg (ref 26.0–34.0)
MCHC: 34.9 g/dL (ref 30.0–36.0)
MCV: 88.1 fL (ref 78.0–100.0)
Monocytes Absolute: 1.2 10*3/uL — ABNORMAL HIGH (ref 0.1–1.0)
Monocytes Relative: 9 % (ref 3–12)
NEUTROS ABS: 11.7 10*3/uL — AB (ref 1.7–7.7)
Neutrophils Relative %: 85 % — ABNORMAL HIGH (ref 43–77)
PLATELETS: 147 10*3/uL — AB (ref 150–400)
RBC: 4.03 MIL/uL — ABNORMAL LOW (ref 4.22–5.81)
RDW: 13.4 % (ref 11.5–15.5)
WBC: 13.8 10*3/uL — AB (ref 4.0–10.5)

## 2014-11-17 LAB — CK: Total CK: 115 U/L (ref 49–397)

## 2014-11-17 LAB — RAPID URINE DRUG SCREEN, HOSP PERFORMED
Amphetamines: POSITIVE — AB
Barbiturates: NOT DETECTED
Benzodiazepines: NOT DETECTED
Cocaine: NOT DETECTED
Opiates: NOT DETECTED
TETRAHYDROCANNABINOL: NOT DETECTED

## 2014-11-17 LAB — PROTIME-INR
INR: 1.32 (ref 0.00–1.49)
Prothrombin Time: 16.5 seconds — ABNORMAL HIGH (ref 11.6–15.2)

## 2014-11-17 LAB — VITAMIN B12: VITAMIN B 12: 900 pg/mL (ref 180–914)

## 2014-11-17 LAB — MRSA PCR SCREENING: MRSA BY PCR: NEGATIVE

## 2014-11-17 LAB — LACTIC ACID, PLASMA: Lactic Acid, Venous: 0.6 mmol/L (ref 0.5–2.0)

## 2014-11-17 LAB — SEDIMENTATION RATE: Sed Rate: 11 mm/hr (ref 0–16)

## 2014-11-17 LAB — TSH: TSH: 0.372 u[IU]/mL (ref 0.350–4.500)

## 2014-11-17 LAB — HIV ANTIBODY (ROUTINE TESTING W REFLEX): HIV Screen 4th Generation wRfx: NONREACTIVE

## 2014-11-17 LAB — RAPID STREP SCREEN (MED CTR MEBANE ONLY): Streptococcus, Group A Screen (Direct): NEGATIVE

## 2014-11-17 LAB — LACTATE DEHYDROGENASE: LDH: 145 U/L (ref 98–192)

## 2014-11-17 LAB — C-REACTIVE PROTEIN: CRP: 9.4 mg/dL — AB (ref <1.0)

## 2014-11-17 MED ORDER — ACETAMINOPHEN 650 MG RE SUPP
650.0000 mg | Freq: Four times a day (QID) | RECTAL | Status: DC | PRN
  Administered 2014-11-17 – 2014-11-18 (×2): 650 mg via RECTAL
  Filled 2014-11-17 (×3): qty 1

## 2014-11-17 MED ORDER — SODIUM CHLORIDE 0.9 % IV BOLUS (SEPSIS)
1000.0000 mL | Freq: Once | INTRAVENOUS | Status: AC
  Administered 2014-11-17: 1000 mL via INTRAVENOUS

## 2014-11-17 MED ORDER — GADOBENATE DIMEGLUMINE 529 MG/ML IV SOLN
20.0000 mL | Freq: Once | INTRAVENOUS | Status: AC | PRN
  Administered 2014-11-17: 19 mL via INTRAVENOUS

## 2014-11-17 MED ORDER — ONDANSETRON HCL 4 MG PO TABS: 4.0000 mg | ORAL_TABLET | Freq: Four times a day (QID) | ORAL | Status: DC | PRN

## 2014-11-17 MED ORDER — ONDANSETRON HCL 4 MG/2ML IJ SOLN
4.0000 mg | Freq: Four times a day (QID) | INTRAMUSCULAR | Status: DC | PRN
  Administered 2014-11-18: 4 mg via INTRAVENOUS
  Filled 2014-11-17: qty 2

## 2014-11-17 MED ORDER — DEXTROSE 5 % IV SOLN
2.0000 g | Freq: Two times a day (BID) | INTRAVENOUS | Status: DC
  Administered 2014-11-17: 2 g via INTRAVENOUS
  Filled 2014-11-17: qty 2

## 2014-11-17 MED ORDER — VANCOMYCIN HCL IN DEXTROSE 1-5 GM/200ML-% IV SOLN: 1000.0000 mg | Freq: Once | INTRAVENOUS | Status: DC

## 2014-11-17 MED ORDER — VANCOMYCIN HCL IN DEXTROSE 1-5 GM/200ML-% IV SOLN
1000.0000 mg | Freq: Three times a day (TID) | INTRAVENOUS | Status: DC
  Administered 2014-11-17 – 2014-11-18 (×4): 1000 mg via INTRAVENOUS
  Filled 2014-11-17 (×5): qty 200

## 2014-11-17 MED ORDER — SODIUM CHLORIDE 0.9 % IV SOLN
INTRAVENOUS | Status: DC
  Administered 2014-11-17 – 2014-11-18 (×3): via INTRAVENOUS
  Administered 2014-11-18: 100 mL via INTRAVENOUS
  Administered 2014-11-18 – 2014-11-19 (×2): via INTRAVENOUS

## 2014-11-17 MED ORDER — IOHEXOL 300 MG/ML  SOLN
25.0000 mL | Freq: Once | INTRAMUSCULAR | Status: AC | PRN
  Administered 2014-11-17: 25 mL via ORAL

## 2014-11-17 MED ORDER — HEPARIN SODIUM (PORCINE) 5000 UNIT/ML IJ SOLN
5000.0000 [IU] | Freq: Three times a day (TID) | INTRAMUSCULAR | Status: DC
  Administered 2014-11-17 – 2014-11-19 (×7): 5000 [IU] via SUBCUTANEOUS
  Filled 2014-11-17 (×8): qty 1

## 2014-11-17 MED ORDER — CYCLOBENZAPRINE HCL 10 MG PO TABS
10.0000 mg | ORAL_TABLET | Freq: Three times a day (TID) | ORAL | Status: DC | PRN
Start: 2014-11-17 — End: 2014-11-19
  Administered 2014-11-17: 10 mg via ORAL
  Filled 2014-11-17: qty 1

## 2014-11-17 MED ORDER — IOHEXOL 300 MG/ML  SOLN
100.0000 mL | Freq: Once | INTRAMUSCULAR | Status: AC | PRN
  Administered 2014-11-17: 100 mL via INTRAVENOUS

## 2014-11-17 MED ORDER — LISDEXAMFETAMINE DIMESYLATE 20 MG PO CAPS
40.0000 mg | ORAL_CAPSULE | ORAL | Status: DC
  Administered 2014-11-17: 40 mg via ORAL
  Filled 2014-11-17: qty 2

## 2014-11-17 MED ORDER — SODIUM CHLORIDE 0.9 % IJ SOLN
3.0000 mL | Freq: Two times a day (BID) | INTRAMUSCULAR | Status: DC
  Administered 2014-11-17 – 2014-11-18 (×4): 3 mL via INTRAVENOUS

## 2014-11-17 MED ORDER — ACETAMINOPHEN 325 MG PO TABS
650.0000 mg | ORAL_TABLET | Freq: Four times a day (QID) | ORAL | Status: DC | PRN
  Administered 2014-11-17 – 2014-11-19 (×5): 650 mg via ORAL
  Filled 2014-11-17 (×5): qty 2

## 2014-11-17 MED ORDER — HYDROMORPHONE HCL 1 MG/ML IJ SOLN
1.0000 mg | INTRAMUSCULAR | Status: DC | PRN
  Administered 2014-11-17 (×2): 1 mg via INTRAVENOUS
  Administered 2014-11-17 (×2): 0.5 mg via INTRAVENOUS
  Administered 2014-11-18 – 2014-11-19 (×6): 1 mg via INTRAVENOUS
  Filled 2014-11-17 (×9): qty 1

## 2014-11-17 NOTE — Progress Notes (Signed)
ANTIBIOTIC CONSULT NOTE - INITIAL  Pharmacy Consult for vancomycin Indication: Meningitis  Allergies  Allergen Reactions  . Penicillins Other (See Comments)    High fever    Patient Measurements: Height: 6' 0.05" (183 cm) Weight: 195 lb (88.451 kg) IBW/kg (Calculated) : 77.71 Adjusted Body Weight:   Vital Signs: Temp: 102.7 F (39.3 C) (05/25 2314) Temp Source: Rectal (05/25 2314) BP: 115/54 mmHg (05/26 0000) Pulse Rate: 113 (05/26 0000) Intake/Output from previous day: 05/25 0701 - 05/26 0700 In: 2550 [I.V.:2550] Out: -  Intake/Output from this shift: Total I/O In: 2550 [I.V.:2550] Out: -   Labs:  Recent Labs  11/16/14 2050  WBC 12.2*  HGB 14.2  PLT 189  CREATININE 1.16   Estimated Creatinine Clearance: 94.9 mL/min (by C-G formula based on Cr of 1.16). No results for input(s): VANCOTROUGH, VANCOPEAK, VANCORANDOM, GENTTROUGH, GENTPEAK, GENTRANDOM, TOBRATROUGH, TOBRAPEAK, TOBRARND, AMIKACINPEAK, AMIKACINTROU, AMIKACIN in the last 72 hours.   Microbiology: Recent Results (from the past 720 hour(s))  Gram stain     Status: None   Collection Time: 11/16/14  9:49 PM  Result Value Ref Range Status   Specimen Description BACK  Final   Special Requests NONE  Final   Gram Stain   Final    CYTOSPIN NO ORGANISMS SEEN WBC PRESENT, PREDOMINANTLY MONONUCLEAR Gram Stain Report Called to,Read Back By and Verified With: T DOSTER AT 2336 ON 05.25.16 BY G KONTOS    Report Status 11/16/2014 FINAL  Final    Medical History: Past Medical History  Diagnosis Date  . Back pain     spasms    Medications:  Anti-infectives    Start     Dose/Rate Route Frequency Ordered Stop   11/17/14 0800  cefTRIAXone (ROCEPHIN) 2 g in dextrose 5 % 50 mL IVPB     2 g 100 mL/hr over 30 Minutes Intravenous Every 12 hours 11/17/14 0101     11/17/14 0115  vancomycin (VANCOCIN) IVPB 1000 mg/200 mL premix  Status:  Discontinued     1,000 mg 200 mL/hr over 60 Minutes Intravenous  Once  11/17/14 0101 11/17/14 0112   11/16/14 2045  cefTRIAXone (ROCEPHIN) 2 g in dextrose 5 % 50 mL IVPB     2 g 100 mL/hr over 30 Minutes Intravenous  Once 11/16/14 2041 11/16/14 2200     Assessment: Patient with r/o meningitis.  [facility]  [facility].    Goal of Therapy:  Vancomycin trough level 15-20 mcg/ml  Plan:  Measure antibiotic drug levels at steady state Follow up culture results Vancomycin 1gm iv q8hr  Will update [facility] with patient transfer.  Darlina GuysGrimsley Jr, Jacquenette ShoneJulian Crowford 11/17/2014,1:14 AM

## 2014-11-17 NOTE — H&P (Signed)
[facility] History and Physical  Patient: Hector Williamson  MRN: [id number]  DOB: 1976/06/17  DOS: the patient was seen and examined on 11/17/2014 PCP: No primary care provider on file.  Referring physician: Dr. Eulis Foster Chief Complaint: Back pain and muscle spasm  HPI: Hector Williamson is a 39 y.o. male with Past medical history back pain. The patient is presenting with complaints of back pain and muscle spasm. He mentions that he has been having complaints of sharp stabbing pain in the middle of his spine on and off occurring every other month since last 5-6 years and has been happening more frequently since last few months. Today he was at his baseline earlier in the morning later on in the noon he started having complaints of back pain which was located on both side of his spine. He also started having spasm on the back muscles. He started taking some ibuprofen but that did not relieve his pain. Later on he started having an electric shocklike sensation going through his spine multiple times. He also started having dizziness and lightheadedness without any vertigo. He denies any blurring of the vision or diplopia but complains of photophobia. He also feels like having a head cold and headache. He complains of headache getting worse with noise. He also complains of neck stiffness. He started having pins and needles-like sensation in both his fingertips as well as in his legs. He started having difficulty ambulating due to generalized weakness and started having fever with chills and therefore was brought to the hospital by EMS. At the time of my evaluation the patient had already undergone a CAT scan of the lumbar puncture and was ready to be transferred to [facility] for MRI ordered by Dr. Eulis Foster. Patient also started having complaints of nausea but no vomiting. Denies any constipation. Denies any burning urination. Denies eating any honey or any canned foods or any food that  he doesn't eat on a regular basis. Denies any travel anywhere denies any rash anywhere. Denies any fall trauma or injury. Denies any smoking or alcohol abuse or drug abuse. Does not use any medications on a regular basis other than lisdexamfetamine . He is unaware of his family history as he is adopted. He denies any prior CNS workup. He denies any loss of control of bowel or bladder. He denies any anesthesia in the perineal region.  The patient is coming from home. And at his baseline independent for most of his ADL.  Review of Systems: as mentioned in the history of present illness.  A comprehensive review of the other systems is negative.  Past Medical History  Diagnosis Date  . Back pain     spasms   History reviewed. No pertinent past surgical history. Social History:  reports that he quit smoking about 15 years ago. His smoking use included Cigarettes. He quit smokeless tobacco use about 17 years ago. He reports that he drinks alcohol. He reports that he does not use illicit drugs.  Allergies  Allergen Reactions  . Penicillins Other (See Comments)    High fever    History reviewed. No pertinent family history.  Prior to Admission medications   Medication Sig Start Date End Date Taking? Authorizing Provider  ibuprofen (ADVIL,MOTRIN) 200 MG tablet Take 400 mg by mouth every 6 (six) hours as needed for fever or moderate pain.   Yes Historical Provider, MD  lisdexamfetamine (VYVANSE) 40 MG capsule Take 40 mg by mouth every morning.   Yes Historical  Provider, MD  albuterol (PROVENTIL HFA;VENTOLIN HFA) 108 (90 BASE) MCG/ACT inhaler Inhale 2 puffs into the lungs every 2 (two) hours as needed for wheezing or shortness of breath (cough). 06/08/11 06/07/12  Riki Altes, MD    Physical Exam: Filed Vitals:   11/16/14 2315 11/16/14 2330 11/17/14 0000 11/17/14 0100  BP: 107/60 113/54 115/54   Pulse: 119 109 113   Temp:      TempSrc:      Resp: 25 16 20    Height:    6' 0.05"  (1.83 m)  Weight:      SpO2: 98% 93% 99%     General: Alert, Awake and Oriented to Time, Place and Person. Appear in marked distress Eyes: PERRL ENT: Oral Mucosa clear moist. Neck: no JVD Cardiovascular: S1 and S2 Present, no Murmur, Peripheral Pulses Present Respiratory: Bilateral Air entry equal and Decreased,  Clear to Auscultation, no Crackles, no wheezes Abdomen: Bowel Sound present, Soft and right side of the abdomen tender Skin: No  Rash Extremities: no  Pedal edema, no calf tenderness Neurologic:  Mental status alert awake and oriented, Cranial Nerves pupils are reactive, good cough reflex, good gag reflex, speech appropriate. Motor strebilaterally equal upper extremity bilaterally equal lower extremity. 4 x 5 bilaterally. Sensation present to light touch in both upper and lower extremity, reflexes difficult to elicit biceps as well as knee and ankle reflexes, babinski equivocal, Proprioception present, Cerebellar test finger-nose-finger normal no pronator drift.  Labs on Admission:  CBC:  Recent Labs Lab 11/16/14 2050  WBC 12.2*  NEUTROABS 10.7*  HGB 14.2  HCT 40.2  MCV 87.2  PLT 189    CMP     Component Value Date/Time   NA 137 11/16/2014 2050   K 4.0 11/16/2014 2050   CL 102 11/16/2014 2050   CO2 23 11/16/2014 2050   GLUCOSE 102* 11/16/2014 2050   BUN 20 11/16/2014 2050   CREATININE 1.16 11/16/2014 2050   CALCIUM 9.6 11/16/2014 2050   PROT 7.6 11/16/2014 2050   ALBUMIN 4.5 11/16/2014 2050   AST 33 11/16/2014 2050   ALT 30 11/16/2014 2050   ALKPHOS 60 11/16/2014 2050   BILITOT 1.6* 11/16/2014 2050   GFRNONAA >60 11/16/2014 2050   GFRAA >60 11/16/2014 2050    No results for input(s): LIPASE, AMYLASE in the last 168 hours.  No results for input(s): CKTOTAL, CKMB, CKMBINDEX, TROPONINI in the last 168 hours. BNP (last 3 results) No results for input(s): BNP in the last 8760 hours.  ProBNP (last 3 results) No results for input(s): PROBNP in the  last 8760 hours.   Radiological Exams on Admission: Dg Chest 1 View  11/16/2014   CLINICAL DATA:  Fever and shortness of breath  EXAM: CHEST  1 VIEW  COMPARISON:  June 08, 2011  FINDINGS: There is no edema or consolidation. Heart size and pulmonary vascularity are normal. No adenopathy. No bone lesions.  IMPRESSION: No edema or consolidation.   Electronically Signed   By: Lowella Grip III M.D.   On: 11/16/2014 21:19   Ct Head Wo Contrast  11/16/2014   CLINICAL DATA:  Initial evaluation for acute headache, weakness.  EXAM: CT HEAD WITHOUT CONTRAST  TECHNIQUE: Contiguous axial images were obtained from the base of the skull through the vertex without intravenous contrast.  COMPARISON:  None.  FINDINGS: There is no acute intracranial hemorrhage or infarct. No mass lesion or midline shift. Gray-white matter differentiation is well maintained. Ventricles are normal in size without evidence of  hydrocephalus. CSF containing spaces are within normal limits. No extra-axial fluid collection.  The calvarium is intact.  Orbital soft tissues are within normal limits.  The paranasal sinuses and mastoid air cells are well pneumatized and free of fluid.  Scalp soft tissues are unremarkable.  IMPRESSION: Normal head CT with no acute intracranial process identified.   Electronically Signed   By: Jeannine Boga M.D.   On: 11/16/2014 21:28   Assessment/Plan Principal Problem:   suspected Meningitis Active Problems:   Myelitis   Positive Lhermitte's sign   1. Meningitis The patient is presenting with complaints of sudden onset of back pain with muscle spasm and symptoms positive for electric shocklike sensation running through the spine with meningismus. He has fever and leukocytosis as well as lactic acidosis and tachycardia. He appears to have generalized weakness but also complains of some pins and needle sensations in both upper and lower extremity. Neurological examination is unremarkable other  than lethargy as well as photophobia. CT scan of the head is unremarkable x-ray of the chest is unremarkable. Lumbar point her findings does not indicate any evidence of infection. The patient has already been treated as possible meningitis. Currently I would continue with vancomycin and ceftriaxone per pharmacy. We will check further workup related to myelopathy including ESR CRP UDS B-12 folate CK HIV. Neurology has been consulted who will be evaluating the patient at [facility]. Patient remains nothing by mouth until further findings. Patient is currently being transferred to [facility] for urgent MRI which was arranged by ED physician Dr. Eulis Foster. Based on the findings of the MRI and neurology consultation further plan will be prescribed.  2. abdominal pain with nausea. When necessary and Zofran IV Pepcid. IV fluids. X-ray abdomen. Check lipase level.  3. lactic acidosis. Likely secondary to dehydration and infection. With signs of possible CNS infection patient meets criteria for sepsis. Aggressive IV hydration. Continue with antibiotics.  Advance goals of care discussion: Full code    Consults: I discussed with Dr. Doy Mince from neurology.  DVT Prophylaxis: subcutaneous Heparin Nutrition: npo  Family Communication: family was present at bedside, opportunity was given to ask question and all questions were answered satisfactorily at the time of interview. Disposition: Admitted as inpatient, step-down unit due to rapidity of the symptoms.  Author: Berle Mull, MD [facility] Pager: [phone] 11/17/2014  If 7PM-7AM, please contact night-coverage [email or web address] Password [email or web address]

## 2014-11-17 NOTE — ED Notes (Signed)
Wasted 25 mg of 50 mg of Demerol; witnessed by Juliette Mangleerri Doster, RN

## 2014-11-17 NOTE — ED Notes (Signed)
Per verbal order Dr. Effie ShyWentz, patient does not need to be monitored during MRI

## 2014-11-17 NOTE — Care Management Note (Signed)
Case Management Note  Patient Details  Name: Hector Williamson MRN: [id number] Date of Birth: [birth year]  Subjective/Objective:                 Poss meningitis versus infection   Action/Plan: will follow   Expected Discharge Date:   [id number]               Expected Discharge Plan:  Home/Self Care  In-House Referral:  NA  Discharge planning Services  CM Consult  Post Acute Care Choice:  NA Choice offered to:  NA  DME Arranged:  N/A DME Agency:  NA  HH Arranged:  NA HH Agency:  NA  Status of Service:  In process, will continue to follow  Medicare Important Message Given:    Date Medicare IM Given:    Medicare IM give by:    Date Additional Medicare IM Given:    Additional Medicare Important Message give by:     If discussed at Long Length of Stay Meetings, dates discussed:    Additional Comments:  Golda AcreDavis, Presly Steinruck Lynn, RN 11/17/2014, 8:47 AM

## 2014-11-17 NOTE — Progress Notes (Signed)
ANTIBIOTIC CONSULT NOTE - FOLLOW UP  Pharmacy Consult for Vancomycin, Cefepime Indication: Empiric fever coverage  Allergies  Allergen Reactions  . Penicillins Other (See Comments)    High fever    Patient Measurements: Height: 6' (182.9 cm) Weight: 188 lb 15 oz (85.7 kg) IBW/kg (Calculated) : 77.6  Vital Signs: Temp: 99.3 F (37.4 C) (05/26 1129) Temp Source: Rectal (05/26 1129) BP: 97/56 mmHg (05/26 1400) Pulse Rate: 82 (05/26 1400) Intake/Output from previous day: 05/25 0701 - 05/26 0700 In: 2850 [I.V.:2650; IV Piggyback:200] Out: 475 [Urine:475]  Labs:  Recent Labs  11/16/14 2050 11/17/14 0700  WBC 12.2* 13.8*  HGB 14.2 12.4*  PLT 189 147*  CREATININE 1.16 1.01   Estimated Creatinine Clearance: 108.8 mL/min (by C-G formula based on Cr of 1.01). No results for input(s): VANCOTROUGH, VANCOPEAK, VANCORANDOM, GENTTROUGH, GENTPEAK, GENTRANDOM, TOBRATROUGH, TOBRAPEAK, TOBRARND, AMIKACINPEAK, AMIKACINTROU, AMIKACIN in the last 72 hours.   Microbiology: Recent Results (from the past 720 hour(s))  CSF culture     Status: None (Preliminary result)   Collection Time: 11/16/14  9:49 PM  Result Value Ref Range Status   Specimen Description BACK  Final   Special Requests NONE  Final   Gram Stain   Final    WBC PRESENT, PREDOMINANTLY MONONUCLEAR NO ORGANISMS SEEN CYTOSPIN SLIDE Gram Stain Report Called to,Read Back By and Verified With: Gram Stain Report Called to,Read Back By and Verified With: TIMED DOSTER AT 2336 ON [id number] BY G KONTOS Performed by [facility] Performed at [facility]    Culture PENDING  Incomplete   Report Status PENDING  Incomplete  Gram stain     Status: None   Collection Time: 11/16/14  9:49 PM  Result Value Ref Range Status   Specimen Description BACK  Final   Special Requests NONE  Final   Gram Stain   Final    CYTOSPIN NO ORGANISMS SEEN WBC PRESENT, PREDOMINANTLY MONONUCLEAR Gram Stain Report Called to,Read Back By  and Verified With: T DOSTER AT 2336 ON 05.25.16 BY G KONTOS    Report Status 11/16/2014 FINAL  Final  MRSA PCR Screening     Status: None   Collection Time: 11/17/14  6:14 AM  Result Value Ref Range Status   MRSA by PCR NEGATIVE NEGATIVE Final    Comment:        The GeneXpert MRSA Assay (FDA approved for NASAL specimens only), is one component of a comprehensive MRSA colonization surveillance program. It is not intended to diagnose MRSA infection nor to guide or monitor treatment for MRSA infections.   Rapid strep screen (not at [facility])     Status: None   Collection Time: 11/17/14  6:27 AM  Result Value Ref Range Status   Streptococcus, Group A Screen (Direct) NEGATIVE NEGATIVE Final    Comment: (NOTE) A Rapid Antigen test may result negative if the antigen level in the sample is below the detection level of this test. The FDA has not cleared this test as a stand-alone test therefore the rapid antigen negative result has reflexed to a Group A Strep culture.     Assessment: 63 yoM presented to ED on 5/25 with sudden onset of fever (104F), chills, malaise, headache and back/neck pain.  PMH includes chronic back pain.  He reports that his wife and daughter were recently sick with viral/bacterial infections.  He was initially started on vancomycin and ceftriaxone for r/o meningitis.  Meningitis is now less likely, and coverage is broadened.  Pharmacy is  consulted to dose vancomycin and cefepime.  5/25 >> Ceftriaxone >> 5/26 5/26 >> Vanc >>    5/25 LP: pending 5/25 blood x2: sent 5/26 Influenza PCR: negative 5/26 Strep: negative 5/26 Resp virus panel:  Today, 11/17/2014:  Tm 105 F  WBC 13.8  SCr 1.01, CrCl > 100 ml/min CG (~ 17800ml/min N)  Goal of Therapy:  Vancomycin trough level 15-20 mcg/ml Appropriate abx dosing, eradication of infection.  Plan:   Continue Vancomycin 1g IV q8h.  Measure Vanc trough at steady state.  Follow up renal fxn, culture results, and  clinical course.  Update:  Due to listed Penicillin allergy (high fever), Dr. Luciana Axeomer now recommends holding Cefepime and continue vancomycin alone for now.  Lynann Beaverhristine Tiffny Gemmer PharmD, BCPS Pager [phone] 11/17/2014 2:21 PM

## 2014-11-17 NOTE — Progress Notes (Signed)
Date:  Nov 17, 2014 U.R. performed for needs and level of care. Will continue to follow for Case Management needs.  Jalexus Brett, RN, BSN, CCM   [phone] 

## 2014-11-17 NOTE — Progress Notes (Signed)
Paged the Hospitalist floor coverage for tonight regarding result of patient's CT scan, order received to change patient's  diet to clear liquid diet due to possible ileus reported on CT Scan, Also made Hospitalist aware of the family's request for patient to be given Doxycycline antibiotic for possible [place] disease, no new order received regarding this. Pt will continue to be monitored thru this shift.

## 2014-11-17 NOTE — Progress Notes (Signed)
Paged Dr Izola Pricemyers with questions from family friend. He has a friend that is a Hospitalist that he is conferring with. He and the pts wife, Amil AmenJulia would like an abdominal CT ordered for persistant abdominal tenderness and questioned if he could be on Doxy for RMSF coverage pending serology results. Page returned. Orders received from DR Izola PriceMyers. Deferred to Dr Luciana Axeomer for Doxy question. Dr Luciana Axeomer paged with question about Doxy. Page returned no orders received at this time pending serology results and atypical course for RMSF.

## 2014-11-17 NOTE — ED Notes (Signed)
Report to Elnita Maxwellheryl, Consulting civil engineerCharge RN [facility]

## 2014-11-17 NOTE — Progress Notes (Signed)
Patient ID: Hector Williamson, male   DOB: 1976-05-23, 39 y.o.   MRN: [id number]  [facility] PROGRESS NOTE  Hector Williamson AXE:[id number] DOB: 03-09-1976 DOA: 11/16/2014 PCP: Pt has no PCP   Brief narrative:    Pt is 39 yo male with known history of chronic back pain, presented to [facility] ED with main concern of sudden onset of fever up to 104F, chills, malaise, associated with constant throbbing generalized headache and neck pain. Pt has one year old daughter who was recently sick with viral illness and has eventually required ABX, wife also got sick with fevers and required ABX.  Assessment/Plan:    Principal Problem:   Sepsis secondary to unclear source, ? Viral  - criteria for sepsis met on admission with T 105 F, HR 122, RR up to 30 bpm, WBC 12 - 13 K, lactic acid >3 - source is not clear and possibly viral and secondary to contact exposure (family) - per neurologist, CNS unlikely source  - respiratory viral panel pending  - blood culture pending, HIV pending  - final report on LP pending  - continue vancomycin and rocephin day #2 and readjust the regimen as clinically indicated  - repeat lactic acid and continue IVF  Active Problems:   Headache and neck pain - likely related to principal problem, ? viral etiology - provide analgesia as needed   DVT prophylaxis - Heparin SQ  Code Status: Full.  Family Communication:  plan of care discussed with the patient and wife at bedside  Disposition Plan: Stable for transfer to telemetry unit   IV access:  Peripheral IV  Procedures and diagnostic studies:    Dg Chest 1 View 11/16/2014   No edema or consolidation.     Ct Head Wo Contrast 11/16/2014   Normal head CT with no acute intracranial process identified.    Mr Cervical Spine W Wo Contrast 11/17/2014   No acute abnormality within the cervical spine. 2. Reversal of the normal cervical lordosis with mild multilevel degenerative disc disease as detailed above, most severe at  C5-6. No significant canal or foraminal narrowing identified within the cervical spine.    Mr Thoracic Spine W Wo Contrast 11/17/2014  Normal MRI of the thoracic spine. No evidence for infection or cord signal abnormality. No significant degenerative changes identified.   Mr Lumbar Spine W Wo Contrast 11/17/2014   No acute abnormality within the lumbar spine. No evidence for infection. No other significant degenerative changes within the lumbar spine.     Dg Abd 2 Views 11/17/2014   Negative.     Medical Consultants:  Neurology   Other Consultants:  None  IAnti-Infectives:   Vancomycin 5/25 --> Rocephin 5/25 -->  [name], MD  [facility] Pager [phone]  If 7PM-7AM, please contact night-coverage [email or web address] Password [email or web address] 11/17/2014, 10:37 AM   LOS: 0 days   HPI/Subjective: No events overnight. Still with headache and neck pain, 7/10 in severity   Objective: Filed Vitals:   11/17/14 0703 11/17/14 0748 11/17/14 0800 11/17/14 0902  BP: 104/51  105/49 96/54  Pulse: 89  87 94  Temp:  102.3 F (39.1 C)    TempSrc:  Axillary    Resp: _0 Height:      Weight:      SpO2: 98%  99% 97%    Intake/Output Summary (Last 24 hours) at 11/17/14 1037 Last data filed at 11/17/14 0806  Gross per 24 hour  Intake   3100 ml  Output    475 ml  Net   2625 ml    Exam:   General:  Pt is alert, follows commands appropriately, not in acute distress  Cardiovascular: Regular rate and rhythm, S1/S2, no murmurs, no rubs, no gallops  Respiratory: Clear to auscultation bilaterally, no wheezing, no crackles, no rhonchi  Abdomen: Soft, non tender, non distended, bowel sounds present, no guarding  Extremities: No edema, pulses DP and PT palpable bilaterally  Neuro: Grossly nonfocal  Data Reviewed: Basic Metabolic Panel:  Recent Labs Lab 11/16/14 2050 11/17/14 0700  NA 137 134*  K 4.0 3.5  CL 102 103  CO2 23 24  GLUCOSE 102* 117*  BUN 20 15  CREATININE 1.16 1.01   CALCIUM 9.6 8.0*   Liver Function Tests:  Recent Labs Lab 11/16/14 2050 11/17/14 0700  AST 33 21  ALT 30 26  ALKPHOS 60 50  BILITOT 1.6* 1.3*  PROT 7.6 6.3*  ALBUMIN 4.5 3.7    Recent Labs Lab 11/17/14 0700  LIPASE 15*   CBC:  Recent Labs Lab 11/16/14 2050 11/17/14 0700  WBC 12.2* 13.8*  NEUTROABS 10.7* 11.7*  HGB 14.2 12.4*  HCT 40.2 35.5*  MCV 87.2 88.1  PLT 189 147*   Cardiac Enzymes:  Recent Labs Lab 11/17/14 0700  CKTOTAL 115    Recent Results (from the past 240 hour(s))  CSF culture     Status: None (Preliminary result)   Collection Time: 11/16/14  9:49 PM  Result Value Ref Range Status   Specimen Description BACK  Final   Special Requests NONE  Final   Gram Stain   Final    WBC PRESENT, PREDOMINANTLY MONONUCLEAR NO ORGANISMS SEEN CYTOSPIN SLIDE Gram Stain Report Called to,Read Back By and Verified With: Gram Stain Report Called to,Read Back By and Verified With: TIMED DOSTER AT 2336 ON [id number] BY G KONTOS Performed by [facility] Performed at [place] PENDING  Incomplete   Report Status PENDING  Incomplete  Gram stain     Status: None   Collection Time: 11/16/14  9:49 PM  Result Value Ref Range Status   Specimen Description BACK  Final   Special Requests NONE  Final   Gram Stain   Final    CYTOSPIN NO ORGANISMS SEEN WBC PRESENT, PREDOMINANTLY MONONUCLEAR Gram Stain Report Called to,Read Back By and Verified With: T DOSTER AT 2336 ON 05.25.16 BY G KONTOS    Report Status 11/16/2014 FINAL  Final  MRSA PCR Screening     Status: None   Collection Time: 11/17/14  6:14 AM  Result Value Ref Range Status   MRSA by PCR NEGATIVE NEGATIVE Final    Comment:        The GeneXpert MRSA Assay (FDA approved for NASAL specimens only), is one component of a comprehensive MRSA colonization surveillance program. It is not intended to diagnose MRSA infection nor to guide or monitor treatment for MRSA infections.    Rapid strep screen (not at [facility])     Status: None   Collection Time: 11/17/14  6:27 AM  Result Value Ref Range Status   Streptococcus, Group A Screen (Direct) NEGATIVE NEGATIVE Final    Comment: (NOTE) A Rapid Antigen test may result negative if the antigen level in the sample is below the detection level of this test. The FDA has not cleared this test as a stand-alone test therefore the rapid antigen negative result has reflexed to a Group A Strep culture.  Scheduled Meds: . cefTRIAXone (ROCEPHIN)  IV  2 g Intravenous Q12H  . heparin  5,000 Units Subcutaneous 3 times per day  . sodium chloride  3 mL Intravenous Q12H  . vancomycin  1,000 mg Intravenous Q8H   Continuous Infusions: . sodium chloride

## 2014-11-17 NOTE — Progress Notes (Signed)
Transfer orders received. SBP 72-83. Pt remains lethargic and weak. Dr Lenise ArenaMeyers paged.

## 2014-11-17 NOTE — Progress Notes (Signed)
Subjective: Continues to have headache, but back pain has improved  Exam: Filed Vitals:   11/17/14 0902  BP: 96/54  Pulse: 94  Temp:   Resp: 16   Gen: In bed, NAD MS: Awake, alert, interactive and appropriate VP:CHEK, Face symmetric Motor: 5/5 bilateral upper extremities. Limited by back pain with lower extremities.  Sensory:intact to LT  Pertinent Labs: No pleocytosis, normal protein and glucose.  Elevated lactate Normal TSH Normal ESR Flu by PCR negative  Impression: 39 yo M with fever, headache, back pain, abdominal pain, nausea with unclear source of infection. Based upon LP results and spinal imaging, my suspicion for a primary neurological process is extremely low. Possibilities include sepsis, gastroenteritis, viral syndrome. With a normal CK, myositis would also be essentially ruled out.   One other concern I would have would be amphetamine induced hyperthermia, though his dose is not particularly high and he denies additional doses or other substances(i.e. Mdma, etc) that could exacerbate this and I have low suspicion for extra doses. Given it's propensity to raise temperature, however, even if it isn't the primary cause I would hold it.   Recommendations: 1) I have discontinued vyvanse.  2) Ok to use analgesics for pain, will defer to primary service.  3)  I have low suspicion for a neurological process at this time and neurology will sign off. Please call with any further questions or concerns.   Roland Rack, MD [facility] [phone]  If 7pm- 7am, please page neurology on call as listed in [place].

## 2014-11-17 NOTE — Progress Notes (Signed)
Pt with SBP in 70's, will give 1 L NS bolus. Cancel transfer to telemetry bed and keep in SDU. ID consult requested.   Debbora PrestoMAGICK-MYERS, ISKRA, MD  [facility] Pager [phone]  If 7PM-7AM, please contact night-coverage [email or web address] Password [email or web address]

## 2014-11-17 NOTE — Consult Note (Signed)
Reason for Consult:Low back pain Referring Physician: patel  CC: Fever and low back pain  HPI: CHIEF WALKUP is an 39 y.o. male who reports that he has been having intermittent problems with back pain and spasms.  On yesterday at about 12 noon noted that he had a fever while at work.  He treated himself with tylenol.  On the way home from work began to have back spasms, more severe than previously.  By the time he got home he could barely walk due to dizziness and headache. Experienced tingling all over his body.  Has not experienced any weakness or bowel/bladder incontinence.    Past Medical History  Diagnosis Date  . Back pain     spasms    No past surgical history on file.  Family history: Adopted  Social History:  reports that he quit smoking about 15 years ago. His smoking use included Cigarettes. He quit smokeless tobacco use about 17 years ago. He reports that he drinks alcohol. He reports that he does not use illicit drugs.  Allergies  Allergen Reactions  . Penicillins Other (See Comments)    High fever    Medications: I have reviewed the patient's current medications. Prior to Admission:  Current outpatient prescriptions:  .  albuterol (PROVENTIL HFA;VENTOLIN HFA) 108 (90 BASE) MCG/ACT inhaler, Inhale 2 puffs into the lungs every 2 (two) hours as needed for wheezing or shortness of breath (cough)., Disp: 1 Inhaler, Rfl: 0 .  ibuprofen (ADVIL,MOTRIN) 200 MG tablet, Take 400 mg by mouth every 6 (six) hours as needed for fever or moderate pain., Disp: , Rfl:  .  lisdexamfetamine (VYVANSE) 40 MG capsule, Take 40 mg by mouth every morning., Disp: , Rfl:   ROS: History obtained from the patient  General ROS: as noted in HPI Psychological ROS: negative for - behavioral disorder, hallucinations, memory difficulties, mood swings or suicidal ideation Ophthalmic ROS: negative for - blurry vision, double vision, eye pain or loss of vision ENT ROS: negative for - epistaxis, nasal  discharge, oral lesions, sore throat, tinnitus or vertigo Allergy and Immunology ROS: negative for - hives or itchy/watery eyes Hematological and Lymphatic ROS: negative for - bleeding problems, bruising or swollen lymph nodes Endocrine ROS: negative for - galactorrhea, hair pattern changes, polydipsia/polyuria or temperature intolerance Respiratory ROS: negative for - cough, hemoptysis, shortness of breath or wheezing Cardiovascular ROS: negative for - chest pain, dyspnea on exertion, edema or irregular heartbeat Gastrointestinal ROS: negative for - abdominal pain, diarrhea, hematemesis, nausea/vomiting or stool incontinence Genito-Urinary ROS: negative for - dysuria, hematuria, incontinence or urinary frequency/urgency Musculoskeletal ROS: negative for - joint swelling or muscular weakness Neurological ROS: as noted in HPI Dermatological ROS: negative for rash and skin lesion changes  Physical Examination: BP 118/75   HR 122   RR 30  Temp 105F   SpO2 100%  HEENT-  Normocephalic, no lesions, without obvious abnormality.  Normal external eye and conjunctiva.  Normal TM's bilaterally.  Normal auditory canals and external ears. Normal external nose, mucus membranes and septum.  Normal pharynx. Cardiovascular- S1, S2 normal, pulses palpable throughout   Lungs- chest clear, no wheezing, rales, normal symmetric air entry Abdomen- soft, non-tender; bowel sounds normal; no masses,  no organomegaly Extremities- no edema Lymph-no adenopathy palpable Musculoskeletal-no joint tenderness, deformity or swelling Skin-warm and dry, no hyperpigmentation, vitiligo, or suspicious lesions  Neurological Examination Mental Status: Alert, oriented, thought content appropriate.  Speech fluent without evidence of aphasia.  Able to follow 3 step commands  without difficulty. Cranial Nerves: II: Discs flat bilaterally; Visual fields grossly normal, pupils equal, round, reactive to light and accommodation III,IV,  VI: ptosis not present, extra-ocular motions intact bilaterally V,VII: smile symmetric, facial light touch sensation normal bilaterally VIII: hearing normal bilaterally IX,X: gag reflex present XI: bilateral shoulder shrug XII: midline tongue extension Motor: Right : Upper extremity   5/5    Left:     Upper extremity   5/5  Lower extremity   5/5     Lower extremity   5/5 Tone and bulk:normal tone throughout; no atrophy noted Sensory: Pinprick and light touch intact throughout, bilaterally Deep Tendon Reflexes: 2+ and symmetric throughout Plantars: Right: downgoing   Left: downgoing Cerebellar: normal finger-to-nose and normal heel-to-shin testing bilaterally   Laboratory Studies:   Basic Metabolic Panel:  Recent Labs Lab 11/16/14 2050  NA 137  K 4.0  CL 102  CO2 23  GLUCOSE 102*  BUN 20  CREATININE 1.16  CALCIUM 9.6    Liver Function Tests:  Recent Labs Lab 11/16/14 2050  AST 33  ALT 30  ALKPHOS 60  BILITOT 1.6*  PROT 7.6  ALBUMIN 4.5   No results for input(s): LIPASE, AMYLASE in the last 168 hours. No results for input(s): AMMONIA in the last 168 hours.  CBC:  Recent Labs Lab 11/16/14 2050  WBC 12.2*  NEUTROABS 10.7*  HGB 14.2  HCT 40.2  MCV 87.2  PLT 189    Cardiac Enzymes: No results for input(s): CKTOTAL, CKMB, CKMBINDEX, TROPONINI in the last 168 hours.  BNP: Invalid input(s): POCBNP  CBG: No results for input(s): GLUCAP in the last 168 hours.  Microbiology: Results for orders placed or performed during the hospital encounter of 11/16/14  CSF culture     Status: None (Preliminary result)   Collection Time: 11/16/14  9:49 PM  Result Value Ref Range Status   Specimen Description BACK  Final   Special Requests NONE  Final   Gram Stain   Final    WBC PRESENT, PREDOMINANTLY MONONUCLEAR NO ORGANISMS SEEN CYTOSPIN SLIDE Performed at [facility]    Culture PENDING  Incomplete   Report Status PENDING  Incomplete  Gram stain      Status: None   Collection Time: 11/16/14  9:49 PM  Result Value Ref Range Status   Specimen Description BACK  Final   Special Requests NONE  Final   Gram Stain   Final    CYTOSPIN NO ORGANISMS SEEN WBC PRESENT, PREDOMINANTLY MONONUCLEAR Gram Stain Report Called to,Read Back By and Verified With: T DOSTER AT 2336 ON 05.25.16 BY G KONTOS    Report Status 11/16/2014 FINAL  Final    Coagulation Studies: No results for input(s): LABPROT, INR in the last 72 hours.  Urinalysis:   Recent Labs Lab 11/16/14 2208  COLORURINE YELLOW  LABSPEC 1.023  PHURINE 8.0  GLUCOSEU NEGATIVE  HGBUR NEGATIVE  BILIRUBINUR NEGATIVE  KETONESUR NEGATIVE  PROTEINUR NEGATIVE  UROBILINOGEN 0.2  NITRITE NEGATIVE  LEUKOCYTESUR NEGATIVE    Lipid Panel:  No results found for: CHOL, TRIG, HDL, CHOLHDL, VLDL, LDLCALC  HgbA1C: No results found for: HGBA1C  Urine Drug Screen:  No results found for: LABOPIA, COCAINSCRNUR, LABBENZ, AMPHETMU, THCU, LABBARB  Alcohol Level: No results for input(s): ETH in the last 168 hours.   Imaging: Dg Chest 1 View  11/16/2014   CLINICAL DATA:  Fever and shortness of breath  EXAM: CHEST  1 VIEW  COMPARISON:  June 08, 2011  FINDINGS: There  is no edema or consolidation. Heart size and pulmonary vascularity are normal. No adenopathy. No bone lesions.  IMPRESSION: No edema or consolidation.   Electronically Signed   By: Bretta Bang III M.D.   On: 11/16/2014 21:19   Ct Head Wo Contrast  11/16/2014   CLINICAL DATA:  Initial evaluation for acute headache, weakness.  EXAM: CT HEAD WITHOUT CONTRAST  TECHNIQUE: Contiguous axial images were obtained from the base of the skull through the vertex without intravenous contrast.  COMPARISON:  None.  FINDINGS: There is no acute intracranial hemorrhage or infarct. No mass lesion or midline shift. Gray-white matter differentiation is well maintained. Ventricles are normal in size without evidence of hydrocephalus. CSF containing  spaces are within normal limits. No extra-axial fluid collection.  The calvarium is intact.  Orbital soft tissues are within normal limits.  The paranasal sinuses and mastoid air cells are well pneumatized and free of fluid.  Scalp soft tissues are unremarkable.  IMPRESSION: Normal head CT with no acute intracranial process identified.   Electronically Signed   By: Rise Mu M.D.   On: 11/16/2014 21:28   Mr Cervical Spine W Wo Contrast  11/17/2014   CLINICAL DATA:  Initial evaluation for acute back pain, fever, leukocytosis.  EXAM: MRI CERVICAL SPINE WITHOUT AND WITH CONTRAST  TECHNIQUE: Multiplanar and multiecho pulse sequences of the cervical spine, to include the craniocervical junction and cervicothoracic junction, were obtained according to standard protocol without and with intravenous contrast.  CONTRAST:  19mL MULTIHANCE GADOBENATE DIMEGLUMINE 529 MG/ML IV SOLN  COMPARISON:  None available.  FINDINGS: Study is degraded by motion artifact.  Visualized portions of the brain and posterior fossa demonstrate a normal appearance with normal signal intensity. Craniocervical junction widely patent.  There is reversal of the normal cervical lordosis with apex at C5-6. No listhesis. Vertebral body heights well maintained. No fracture. No bone marrow edema. No evidence for osteomyelitis discitis or other active infection. No abnormal enhancement.  Paraspinous soft tissues within normal limits. Normal intravascular flow voids present within the vertebral arteries bilaterally.  C2-3:  Negative.  C3-4: Very mild bilateral uncovertebral spurring and facet arthrosis. There is resultant mild bilateral foraminal narrowing. No significant central canal stenosis.  C4-5: Very mild bilateral uncovertebral spurring without significant stenosis.  C5-6: Mild diffuse degenerative disc osteophyte with bilateral uncovertebral spurring and facet arthrosis. There is resultant mild left foraminal narrowing. Posterior disc  osteophyte indents and partially effaces the ventral thecal sac and results in mild canal stenosis. There is mild flattening of the cervical spinal cord distal without cord signal changes.  C6-7: Very mild degenerative disc bulge without significant stenosis.  C7-T1:  Negative.  IMPRESSION: 1. No acute abnormality within the cervical spine. 2. Reversal of the normal cervical lordosis with mild multilevel degenerative disc disease as detailed above, most severe at C5-6. No significant canal or foraminal narrowing identified within the cervical spine.   Electronically Signed   By: Rise Mu M.D.   On: 11/17/2014 05:32     Assessment/Plan: 39 year old male presenting with fever and back pain.  Has had back pain prior to this presentation.  No history of bowel or bladder incontinence.  Neurological examination non-focal.  MRI of the cervical, thoracic and lumbar spines were personally reviewed.  No evidence of cord compromise, discitis, osteomyelitis or bone marrow edema.  Some multilevel degenerative disease noted.  Patient febrile and worsening of back pain likely secondary to presence of fever but not the source of fever.  LP shows no evidence of infection.  Doubt fever from a CNS etiology.    Recommendations: 1.  Agree with medical work up for source.    Thana FarrLeslie Saranda Legrande, MD [facility] [phone] 11/17/2014, 5:38 AM

## 2014-11-17 NOTE — ED Notes (Signed)
Arrangements made [facility].  Spoke with Florentina AddisonKatie in MRI, made aware of patient.  [facility] notified of need for transport.  Elliot GurneyWoody, Consulting civil engineerCharge RN made aware of transport.

## 2014-11-17 NOTE — Consult Note (Addendum)
[facility]     Reason for Consult:fever    Referring Physician: Dr. Izola Price  Principal Problem:   suspected Meningitis Active Problems:   Myelitis   Positive Lhermitte's sign   Fever   . cefTRIAXone (ROCEPHIN)  IV  2 g Intravenous Q12H  . heparin  5,000 Units Subcutaneous 3 times per day  . sodium chloride  3 mL Intravenous Q12H  . vancomycin  1,000 mg Intravenous Q8H    Recommendations: I will broaden to cefepime with vancomycin pending work up  lfts in am I will check for acute HIV, RMSF sent already. Will consider CT abd and chest in am if he continues to have high fever, abd pain.   ADDENDUM: since he also has a reaction to penicillin as 'fever' I am going to hold off on cefepime.  If his clinical course changes, can add if needed.  Will just continue vancomycin.   Assessment: He has high fever more typical for medication reaction vs infection.  No obvious infectious source if so.  With his diffuse symptomatology, would think viral etiology.     Antibiotics: Vancomycin and ceftriaxone  HPI: Hector Williamson is a 39 y.o. male with presumably ADHD on lisdexamfetamine for years who had sudden onset of high fever and back pain including neck pain and diffuse headache.  Some abdominal pain but no n/v/d.  Some SOB and cough.  History of smoking but quit 17 years ago.  No OTC or herbal medications.  No travel.  [employer].  Has 62 year old kid with recent viral illness.  Had LP with 1 WBC, normal protein and glucose.  Lactate was elevated, hypotensive, tachycardic.  Fever up to 105.  No rashes.  No arthritis.  Had bilateral knee arthralgias.  WBC and fever curve have improved.  Repeat blood cultures with fever.    Review of Systems: A comprehensive review of systems was negative.  Past Medical History  Diagnosis Date  . Back pain     spasms    History  Substance Use Topics  . Smoking status: Former Smoker    Types: Cigarettes    Quit  date: 11/16/1999  . Smokeless tobacco: Former Neurosurgeon    Quit date: 11/15/1997  . Alcohol Use: Yes     Comment: occassionally    History reviewed. No pertinent family history. Allergies  Allergen Reactions  . Penicillins Other (See Comments)    High fever    OBJECTIVE: Blood pressure 107/53, pulse 77, temperature 99.3 F (37.4 C), temperature source Rectal, resp. rate 13, height 6' (1.829 m), weight 188 lb 15 oz (85.7 kg), SpO2 100 %. General: awake, alert, mild distress with rigors, back pain Skin: no rashes Lungs: CTA B Cor: RRR without m Abdomen: soft, nt, nd GU: condom cath Ext: no edema  Microbiology: Recent Results (from the past 240 hour(s))  CSF culture     Status: None (Preliminary result)   Collection Time: 11/16/14  9:49 PM  Result Value Ref Range Status   Specimen Description BACK  Final   Special Requests NONE  Final   Gram Stain   Final    WBC PRESENT, PREDOMINANTLY MONONUCLEAR NO ORGANISMS SEEN CYTOSPIN SLIDE Gram Stain Report Called to,Read Back By and Verified With: Gram Stain Report Called to,Read Back By and Verified With: TIMED DOSTER AT 2336 ON [id number] BY G KONTOS Performed by [facility] Performed at [facility]    Culture PENDING  Incomplete  Report Status PENDING  Incomplete  Gram stain     Status: None   Collection Time: 11/16/14  9:49 PM  Result Value Ref Range Status   Specimen Description BACK  Final   Special Requests NONE  Final   Gram Stain   Final    CYTOSPIN NO ORGANISMS SEEN WBC PRESENT, PREDOMINANTLY MONONUCLEAR Gram Stain Report Called to,Read Back By and Verified With: T DOSTER AT 2336 ON 05.25.16 BY G KONTOS    Report Status 11/16/2014 FINAL  Final  MRSA PCR Screening     Status: None   Collection Time: 11/17/14  6:14 AM  Result Value Ref Range Status   MRSA by PCR NEGATIVE NEGATIVE Final    Comment:        The GeneXpert MRSA Assay (FDA approved for NASAL specimens only), is one component of  a comprehensive MRSA colonization surveillance program. It is not intended to diagnose MRSA infection nor to guide or monitor treatment for MRSA infections.   Rapid strep screen (not at [facility])     Status: None   Collection Time: 11/17/14  6:27 AM  Result Value Ref Range Status   Streptococcus, Group A Screen (Direct) NEGATIVE NEGATIVE Final    Comment: (NOTE) A Rapid Antigen test may result negative if the antigen level in the sample is below the detection level of this test. The FDA has not cleared this test as a stand-alone test therefore the rapid antigen negative result has reflexed to a Group A Strep culture.     Staci RighterOMER, Kirstan Fentress, MD [facility] [email or web address] [phone] pager  [phone] cell 11/17/2014, 1:58 PM

## 2014-11-17 NOTE — ED Notes (Signed)
Writer notified EDP Wentz of I-stat lactic result.

## 2014-11-18 ENCOUNTER — Inpatient Hospital Stay (HOSPITAL_COMMUNITY): Payer: BLUE CROSS/BLUE SHIELD

## 2014-11-18 DIAGNOSIS — B349 Viral infection, unspecified: Secondary | ICD-10-CM

## 2014-11-18 LAB — CBC
HCT: 36 % — ABNORMAL LOW (ref 39.0–52.0)
Hemoglobin: 12.4 g/dL — ABNORMAL LOW (ref 13.0–17.0)
MCH: 30.8 pg (ref 26.0–34.0)
MCHC: 34.4 g/dL (ref 30.0–36.0)
MCV: 89.3 fL (ref 78.0–100.0)
Platelets: 156 10*3/uL (ref 150–400)
RBC: 4.03 MIL/uL — ABNORMAL LOW (ref 4.22–5.81)
RDW: 13.8 % (ref 11.5–15.5)
WBC: 8.6 10*3/uL (ref 4.0–10.5)

## 2014-11-18 LAB — RESPIRATORY VIRUS PANEL
Adenovirus: NEGATIVE
INFLUENZA A: NEGATIVE
Influenza B: NEGATIVE
METAPNEUMOVIRUS: NEGATIVE
PARAINFLUENZA 2 A: NEGATIVE
PARAINFLUENZA 3 A: NEGATIVE
Parainfluenza 1: NEGATIVE
RESPIRATORY SYNCYTIAL VIRUS A: NEGATIVE
RESPIRATORY SYNCYTIAL VIRUS B: NEGATIVE
Rhinovirus: NEGATIVE

## 2014-11-18 LAB — HEPATIC FUNCTION PANEL
ALK PHOS: 48 U/L (ref 38–126)
ALT: 22 U/L (ref 17–63)
AST: 17 U/L (ref 15–41)
Albumin: 3.3 g/dL — ABNORMAL LOW (ref 3.5–5.0)
BILIRUBIN INDIRECT: 0.8 mg/dL (ref 0.3–0.9)
BILIRUBIN TOTAL: 1.1 mg/dL (ref 0.3–1.2)
Bilirubin, Direct: 0.3 mg/dL (ref 0.1–0.5)
Total Protein: 6.2 g/dL — ABNORMAL LOW (ref 6.5–8.1)

## 2014-11-18 LAB — BASIC METABOLIC PANEL
ANION GAP: 7 (ref 5–15)
BUN: 8 mg/dL (ref 6–20)
CHLORIDE: 101 mmol/L (ref 101–111)
CO2: 25 mmol/L (ref 22–32)
Calcium: 7.9 mg/dL — ABNORMAL LOW (ref 8.9–10.3)
Creatinine, Ser: 0.88 mg/dL (ref 0.61–1.24)
GFR calc Af Amer: >60 mL/min (ref >60)
GFR calc non Af Amer: >60 mL/min (ref >60)
Glucose, Bld: 92 mg/dL (ref 65–99)
POTASSIUM: 3.9 mmol/L (ref 3.5–5.1)
Sodium: 133 mmol/L — ABNORMAL LOW (ref 135–145)

## 2014-11-18 LAB — CK: Total CK: 84 U/L (ref 49–397)

## 2014-11-18 LAB — ROCKY MTN SPOTTED FVR ABS PNL(IGG+IGM)
RMSF IgG: NEGATIVE
RMSF IgM: 0.24 index (ref 0.00–0.89)

## 2014-11-18 LAB — VANCOMYCIN, TROUGH: Vancomycin Tr: 10 ug/mL (ref 10.0–20.0)

## 2014-11-18 MED ORDER — KETOROLAC TROMETHAMINE 30 MG/ML IJ SOLN
30.0000 mg | INTRAMUSCULAR | Status: DC | PRN
  Administered 2014-11-18 – 2014-11-19 (×4): 30 mg via INTRAVENOUS
  Filled 2014-11-18 (×4): qty 1

## 2014-11-18 MED ORDER — KETOROLAC TROMETHAMINE 30 MG/ML IJ SOLN: 30.0000 mg | Freq: Four times a day (QID) | INTRAMUSCULAR | Status: DC | PRN

## 2014-11-18 NOTE — Progress Notes (Signed)
[facility]  Date of Admission:  11/16/2014  Antibiotics: vancomycin  Subjective: Feels better, still with headache  Objective: Temp:  [98.3 F (36.8 C)-102.8 F (39.3 C)] 98.3 F (36.8 C) (05/27 1228) Pulse Rate:  [61-103] 61 (05/27 1228) Resp:  [11-27] 18 (05/27 1228) BP: (80-117)/(36-69) 112/65 mmHg (05/27 1228) SpO2:  [96 %-100 %] 99 % (05/27 1228) Weight:  [190 lb 0.6 oz (86.2 kg)] 190 lb 0.6 oz (86.2 kg) (05/27 0500)  General: awake, mild distress with muscle aches Skin: no rashes Lungs: CTA B Cor: RRR Abdomen: soft, nt, nd   Lab Results Lab Results  Component Value Date   WBC 8.6 11/18/2014   HGB 12.4* 11/18/2014   HCT 36.0* 11/18/2014   MCV 89.3 11/18/2014   PLT 156 11/18/2014    Lab Results  Component Value Date   CREATININE 0.88 11/18/2014   BUN 8 11/18/2014   NA 133* 11/18/2014   K 3.9 11/18/2014   CL 101 11/18/2014   CO2 25 11/18/2014    Lab Results  Component Value Date   ALT 22 11/18/2014   AST 17 11/18/2014   ALKPHOS 48 11/18/2014   BILITOT 1.1 11/18/2014      Microbiology: Recent Results (from the past 240 hour(s))  Culture, blood (routine x 2)     Status: None (Preliminary result)   Collection Time: 11/16/14  8:40 PM  Result Value Ref Range Status   Specimen Description BLOOD RIGHT ANTECUBITAL  Final   Special Requests BOTTLES DRAWN AEROBIC AND ANAEROBIC  Final   Culture   Final           BLOOD CULTURE RECEIVED NO GROWTH TO DATE CULTURE WILL BE HELD FOR 5 DAYS BEFORE ISSUING A FINAL NEGATIVE REPORT Performed at [facility]    Report Status PENDING  Incomplete  Culture, blood (routine x 2)     Status: None (Preliminary result)   Collection Time: 11/16/14  8:40 PM  Result Value Ref Range Status   Specimen Description BLOOD BLOOD LEFT FOREARM  Final   Special Requests BOTTLES DRAWN AEROBIC AND ANAEROBIC  Final   Culture   Final           BLOOD CULTURE RECEIVED NO GROWTH TO DATE CULTURE  WILL BE HELD FOR 5 DAYS BEFORE ISSUING A FINAL NEGATIVE REPORT Note: Culture results may be compromised due to an excessive volume of blood received in culture bottles. Performed at [facility]    Report Status PENDING  Incomplete  CSF culture     Status: None (Preliminary result)   Collection Time: 11/16/14  9:49 PM  Result Value Ref Range Status   Specimen Description BACK  Final   Special Requests NONE  Final   Gram Stain   Final    WBC PRESENT, PREDOMINANTLY MONONUCLEAR NO ORGANISMS SEEN CYTOSPIN SLIDE Gram Stain Report Called to,Read Back By and Verified With: Gram Stain Report Called to,Read Back By and Verified With: TIMED DOSTER AT 2336 ON [id number] BY G KONTOS Performed by [facility] Performed at [facility]    Culture   Final    NO GROWTH 1 DAY Performed at [facility]    Report Status PENDING  Incomplete  Gram stain     Status: None   Collection Time: 11/16/14  9:49 PM  Result Value Ref Range Status   Specimen Description BACK  Final   Special Requests NONE  Final   Gram Stain  Final    CYTOSPIN NO ORGANISMS SEEN WBC PRESENT, PREDOMINANTLY MONONUCLEAR Gram Stain Report Called to,Read Back By and Verified With: T DOSTER AT 2336 ON 05.25.16 BY G KONTOS    Report Status 11/16/2014 FINAL  Final  MRSA PCR Screening     Status: None   Collection Time: 11/17/14  6:14 AM  Result Value Ref Range Status   MRSA by PCR NEGATIVE NEGATIVE Final    Comment:        The GeneXpert MRSA Assay (FDA approved for NASAL specimens only), is one component of a comprehensive MRSA colonization surveillance program. It is not intended to diagnose MRSA infection nor to guide or monitor treatment for MRSA infections.   Rapid strep screen (not at [facility])     Status: None   Collection Time: 11/17/14  6:27 AM  Result Value Ref Range Status   Streptococcus, Group A Screen (Direct) NEGATIVE NEGATIVE Final    Comment: (NOTE) A Rapid Antigen test may  result negative if the antigen level in the sample is below the detection level of this test. The FDA has not cleared this test as a stand-alone test therefore the rapid antigen negative result has reflexed to a Group A Strep culture.     Studies/Results: Dg Chest 1 View  11/16/2014   CLINICAL DATA:  Fever and shortness of breath  EXAM: CHEST  1 VIEW  COMPARISON:  June 08, 2011  FINDINGS: There is no edema or consolidation. Heart size and pulmonary vascularity are normal. No adenopathy. No bone lesions.  IMPRESSION: No edema or consolidation.   Electronically Signed   By: Bretta BangWilliam  Woodruff III M.D.   On: 11/16/2014 21:19   Ct Head Wo Contrast  11/16/2014   CLINICAL DATA:  Initial evaluation for acute headache, weakness.  EXAM: CT HEAD WITHOUT CONTRAST  TECHNIQUE: Contiguous axial images were obtained from the base of the skull through the vertex without intravenous contrast.  COMPARISON:  None.  FINDINGS: There is no acute intracranial hemorrhage or infarct. No mass lesion or midline shift. Gray-white matter differentiation is well maintained. Ventricles are normal in size without evidence of hydrocephalus. CSF containing spaces are within normal limits. No extra-axial fluid collection.  The calvarium is intact.  Orbital soft tissues are within normal limits.  The paranasal sinuses and mastoid air cells are well pneumatized and free of fluid.  Scalp soft tissues are unremarkable.  IMPRESSION: Normal head CT with no acute intracranial process identified.   Electronically Signed   By: Rise MuBenjamin  McClintock M.D.   On: 11/16/2014 21:28   Ct Chest Wo Contrast  11/18/2014   CLINICAL DATA:  Fever and weakness 2-3 days.  EXAM: CT CHEST WITHOUT CONTRAST  TECHNIQUE: Multidetector CT imaging of the chest was performed following the standard protocol without IV contrast.  COMPARISON:  CT abdomen/pelvis 11/17/2014 and chest x-ray 11/16/2014  FINDINGS: There is patchy consolidation within the left lower lobe and  dependent consolidation over the right lower lobe as findings are likely due to atelectasis although cannot exclude infection particularly in the left base. No evidence of effusion. Airways are normal.  No evidence of hilar, mediastinal or axillary adenopathy. Heart and remaining mediastinal structures are within normal.  Images through the upper abdomen demonstrate no change in mild splenomegaly. Remainder the exam is unchanged.  IMPRESSION: Dependent consolidation over the right lower lobe and patchy consolidation in the left lower lobe likely atelectasis, although cannot exclude infection in the left base.  Mild stable splenomegaly.   Electronically  Signed   By: Elberta Fortis M.D.   On: 11/18/2014 12:43   Mr Cervical Spine W Wo Contrast  11/17/2014   CLINICAL DATA:  Initial evaluation for acute back pain, fever, leukocytosis.  EXAM: MRI CERVICAL SPINE WITHOUT AND WITH CONTRAST  TECHNIQUE: Multiplanar and multiecho pulse sequences of the cervical spine, to include the craniocervical junction and cervicothoracic junction, were obtained according to standard protocol without and with intravenous contrast.  CONTRAST:  19mL MULTIHANCE GADOBENATE DIMEGLUMINE 529 MG/ML IV SOLN  COMPARISON:  None available.  FINDINGS: Study is degraded by motion artifact.  Visualized portions of the brain and posterior fossa demonstrate a normal appearance with normal signal intensity. Craniocervical junction widely patent.  There is reversal of the normal cervical lordosis with apex at C5-6. No listhesis. Vertebral body heights well maintained. No fracture. No bone marrow edema. No evidence for osteomyelitis discitis or other active infection. No abnormal enhancement.  Paraspinous soft tissues within normal limits. Normal intravascular flow voids present within the vertebral arteries bilaterally.  C2-3:  Negative.  C3-4: Very mild bilateral uncovertebral spurring and facet arthrosis. There is resultant mild bilateral foraminal  narrowing. No significant central canal stenosis.  C4-5: Very mild bilateral uncovertebral spurring without significant stenosis.  C5-6: Mild diffuse degenerative disc osteophyte with bilateral uncovertebral spurring and facet arthrosis. There is resultant mild left foraminal narrowing. Posterior disc osteophyte indents and partially effaces the ventral thecal sac and results in mild canal stenosis. There is mild flattening of the cervical spinal cord distal without cord signal changes.  C6-7: Very mild degenerative disc bulge without significant stenosis.  C7-T1:  Negative.  IMPRESSION: 1. No acute abnormality within the cervical spine. 2. Reversal of the normal cervical lordosis with mild multilevel degenerative disc disease as detailed above, most severe at C5-6. No significant canal or foraminal narrowing identified within the cervical spine.   Electronically Signed   By: Rise Mu M.D.   On: 11/17/2014 05:32   Mr Thoracic Spine W Wo Contrast  11/17/2014   CLINICAL DATA:  Initial evaluation for acute back pain, fever, leukocytosis.  EXAM: MRI THORACIC SPINE WITHOUT AND WITH CONTRAST  TECHNIQUE: Multiplanar and multiecho pulse sequences of the thoracic spine were obtained without and with intravenous contrast.  CONTRAST:  19mL MULTIHANCE GADOBENATE DIMEGLUMINE 529 MG/ML IV SOLN  COMPARISON:  None available.  FINDINGS: Study is mildly degraded by motion artifact.  Vertebral bodies are normally aligned with preservation of the normal thoracic kyphosis. Vertebral body heights are well preserved. No fracture. No marrow with edema. No evidence for active infection identified within the thoracic spine. No abnormal enhancement.  T1/T2 hyperintense nonenhancing lesion within the inferior aspect of the T5 vertebral body is most consistent with a benign hemangioma. No other focal osseous lesion.  Signal intensity within the thoracic spinal cord is normal. No abnormal T2/STIR hyperintense foci identified. No  abnormal enhancement.  Paraspinous soft tissues are within normal limits. Very mild atelectatic changes present within the visualized lungs.  No significant degenerative changes identified within the thoracic spine. No canal or foraminal stenosis.  IMPRESSION: Normal MRI of the thoracic spine. No evidence for infection or cord signal abnormality. No significant degenerative changes identified.   Electronically Signed   By: Rise Mu M.D.   On: 11/17/2014 05:47   Mr Lumbar Spine W Wo Contrast  11/17/2014   CLINICAL DATA:  Initial evaluation for acute back pain, muscle spasm. Fever, leukocytosis.  EXAM: MRI LUMBAR SPINE WITHOUT AND WITH CONTRAST  TECHNIQUE: Multiplanar  and multiecho pulse sequences of the lumbar spine were obtained without and with intravenous contrast.  CONTRAST:  19mL MULTIHANCE GADOBENATE DIMEGLUMINE 529 MG/ML IV SOLN  COMPARISON:  None.  FINDINGS: For the purposes of this dictation, the lowest well-formed intervertebral disc spaces presumed to be the L5-S1 level, and there presumed to be 5 lumbar type vertebral bodies.  Vertebral bodies are normally aligned with preservation of the normal lumbar lordosis. Vertebral body heights are well maintained. No fracture or listhesis. No bone marrow edema no abnormal enhancement to suggest active infection.  Conus medullaris terminates normally at the L1 level. Signal intensity within the visualized conus is normal. Nerve roots of the cauda equina are unremarkable.  Paraspinous soft tissues are within normal limits. No retroperitoneal adenopathy. Visualized aorta is of normal caliber.  At T11-12 through L4-5, there is no disc bulge or disc protrusion. The intervertebral discs are well hydrated. No canal or foraminal stenosis.  L5-S1: There is mild diffuse disc bulge with disc desiccation. Small associated annular fissure posteriorly. No focal disc herniation. No significant canal stenosis. There is mild right foraminal narrowing. At left neural  foramen is widely patent.  IMPRESSION: 1. No acute abnormality within the lumbar spine. No evidence for infection. 2. Mild degenerative disc bulge at L5-S1 without significant stenosis. No other significant degenerative changes within the lumbar spine.   Electronically Signed   By: Rise Mu M.D.   On: 11/17/2014 05:56   Ct Abdomen Pelvis W Contrast  11/17/2014   CLINICAL DATA:  Persistent abdominal tenderness. Was seen yesterday for influenza symptoms. Fever of 104. Cough, body chills, weakness, headache, shortness of breath, loss of appetite. Urinary catheter in place.  EXAM: CT ABDOMEN AND PELVIS WITH CONTRAST  TECHNIQUE: Multidetector CT imaging of the abdomen and pelvis was performed using the standard protocol following bolus administration of intravenous contrast.  CONTRAST:  OMNIPAQUE IOHEXOL 300 MG/ML SOLN, 25mL OMNIPAQUE IOHEXOL 300 MG/ML SOLN  COMPARISON:  11/17/2014 plain films  FINDINGS: Lower chest: There is significant atelectasis of both lung bases. Heart size is normal.  Upper abdomen: No focal abnormality identified within the liver, pancreas, adrenal glands, or kidneys. The spleen is mildly enlarged, 14 cm in length. No focal splenic lesion identified. Gallbladder is present.  Gastrointestinal tract: The stomach has a normal appearance. There is mild dilatation of proximal small bowel loops, not associated with obstruction. Contrast reaches the normal appearing terminal ileal loops but does not reach the colon at the time of exam. The appendix has a normal appearance. Colonic loops have a moderate stool burden and are otherwise normal in appearance.  Pelvis: The urinary bladder, seminal vesicles, and prostate gland have a normal appearance. No free pelvic fluid. Urinary catheter is not seen and may have been removed.  Retroperitoneum: No retroperitoneal or mesenteric adenopathy. No evidence for aortic aneurysm.  Abdominal wall: Small amount of air and stranding within the right  anterior abdominal wall subcutaneous fat, possibly representing site of recent injection. Otherwise see anterior abdominal wall is unremarkable in appearance.  Osseous structures: Unremarkable.  IMPRESSION: 1. Mild splenomegaly without focal splenic lesion. 2. Mild dilatation of proximal small bowel loops, favoring focal ileus. 3. Normal appendix. 4. Significant atelectasis at both lung bases. 5. Probable right lower quadrant subcutaneous injection site. 6. Correlation recommended regarding interval removal of Foley catheter. Catheter is not seen on the current study.   Electronically Signed   By: Norva Pavlov M.D.   On: 11/17/2014 21:14   Dg Abd 2  Views  11/17/2014   CLINICAL DATA:  Abdominal pain  EXAM: ABDOMEN - 2 VIEW  COMPARISON:  None.  FINDINGS: The bowel gas pattern is normal. There is no evidence of free air. No radio-opaque calculi or other significant radiographic abnormality is seen.  IMPRESSION: Negative.   Electronically Signed   By: Marnee Spring M.D.   On: 11/17/2014 05:45    Assessment/Plan:  1) viral illness - work up unrevealing, improved labs and improved clinically, fever curve.   Continue supportive care, antiinflammatories \ I will be available over the weekend if needed  Staci Righter, MD [facility] [email or web address] [phone] pager   [phone] cell 11/18/2014, 1:56 PM

## 2014-11-18 NOTE — Progress Notes (Signed)
Pt arrived to floor from ICU. Pt assessed and I am in agreement with ICU RN's morning assessment. Julio SicksK. Travonne Schowalter RN

## 2014-11-18 NOTE — Progress Notes (Signed)
Patient ID: Hector Williamson, male   DOB: February 12, 1976, 39 y.o.   MRN: [id number]  [facility] PROGRESS NOTE  Hector Williamson WNU:[id number] DOB: [birth year] DOA: 11/16/2014 PCP: Pt has no PCP   Brief narrative:    Pt is 39 yo male with known history of chronic back pain, presented to [facility] ED with main concern of sudden onset of fever up to 104F, chills, malaise, associated with constant throbbing generalized headache and neck pain. Pt has one year old daughter who was recently sick with viral illness and has eventually required ABX, wife also got sick with fevers and required ABX.  Assessment/Plan:    Principal Problem:   Sepsis secondary to unclear source, ? Viral  - criteria for sepsis met on admission with T 105 F, HR 122, RR up to 30 bpm, WBC 12 - 13 K, lactic acid >3 - source is not clear and possibly viral and secondary to contact exposure (family) - per neurologist, CNS unlikely source  - respiratory viral panel pending  - CT chest requested for further evaluation of significant atelectasis that was noted on CT abdomen - CT abdomen and pelvis with no specific etiology to explain fever - blood culture pending but no growth to date - Blood pressure stabilized, lactic acid now within normal limits, WBC within normal limits - We'll consider discontinuing vancomycin and monitor patient's clinical progress  Active Problems:   Abdominal pain, left upper and lower quadrant, acute - CT abdomen and pelvis notable for ileus and splenomegaly, abdominal exam is unremarkable this morning - No tenderness with palpation in the abdominal area noted on the exam - Continue clear liquid diet    Headache and neck pain - likely related to principal problem, ? viral etiology - provide analgesia as needed   DVT prophylaxis - Heparin SQ  Code Status: Full.  Family Communication:  plan of care discussed with the patient and wife at bedside  Disposition Plan: Stable for transfer to telemetry unit    IV access:  Peripheral IV  Procedures and diagnostic studies:    Dg Chest 1 View 11/16/2014   No edema or consolidation.     Ct Head Wo Contrast 11/16/2014   Normal head CT with no acute intracranial process identified.    Mr Cervical Spine W Wo Contrast 11/17/2014   No acute abnormality within the cervical spine. 2. Reversal of the normal cervical lordosis with mild multilevel degenerative disc disease as detailed above, most severe at C5-6. No significant canal or foraminal narrowing identified within the cervical spine.    Mr Thoracic Spine W Wo Contrast 11/17/2014  Normal MRI of the thoracic spine. No evidence for infection or cord signal abnormality. No significant degenerative changes identified.   Mr Lumbar Spine W Wo Contrast 11/17/2014   No acute abnormality within the lumbar spine. No evidence for infection. No other significant degenerative changes within the lumbar spine.     Dg Abd 2 Views 11/17/2014   Negative.     Medical Consultants:  Neurology   Other Consultants:  None  IAnti-Infectives:   Vancomycin 5/25 --> Rocephin 5/25 -->  [name], MD  [facility] Pager [phone]  If 7PM-7AM, please contact night-coverage [email or web address] Password [email or web address] 11/18/2014, 6:50 AM   LOS: 1 day   HPI/Subjective: No events overnight. Still with headache and neck pain, 7/10 in severity   Objective: Filed Vitals:   11/18/14 0300 11/18/14 0356 11/18/14 0400 11/18/14 0500  BP: 111/59  90/45   Pulse: 80  89   Temp:  102 F (38.9 C)    TempSrc:  Rectal    Resp: 16  24   Height:      Weight:    86.2 kg (190 lb 0.6 oz)  SpO2: 99%  100%     Intake/Output Summary (Last 24 hours) at 11/18/14 0650 Last data filed at 11/18/14 0400  Gross per 24 hour  Intake 5914.67 ml  Output   5075 ml  Net 839.67 ml    Exam:   General:  Pt is alert, follows commands appropriately, not in acute distress  Cardiovascular: Regular rate and rhythm, S1/S2, no murmurs, no rubs, no  gallops  Respiratory: Clear to auscultation bilaterally, no wheezing, no crackles, no rhonchi  Abdomen: Soft, non tender, non distended, bowel sounds present, no guarding  Extremities: No edema, pulses DP and PT palpable bilaterally  Neuro: Grossly nonfocal  Data Reviewed: Basic Metabolic Panel:  Recent Labs Lab 11/16/14 2050 11/17/14 0700 11/18/14 0340  NA 137 134* 133*  K 4.0 3.5 3.9  CL 102 103 101  CO2 _0 GLUCOSE 102* 117* 92  BUN _1 CREATININE 1.16 1.01 0.88  CALCIUM 9.6 8.0* 7.9*   Liver Function Tests:  Recent Labs Lab 11/16/14 2050 11/17/14 0700 11/18/14 0340  AST 33 21 17  ALT _2 ALKPHOS 60 50 48  BILITOT 1.6* 1.3* 1.1  PROT 7.6 6.3* 6.2*  ALBUMIN 4.5 3.7 3.3*    Recent Labs Lab 11/17/14 0700  LIPASE 15*   CBC:  Recent Labs Lab 11/16/14 2050 11/17/14 0700 11/18/14 0340  WBC 12.2* 13.8* 8.6  NEUTROABS 10.7* 11.7*  --   HGB 14.2 12.4* 12.4*  HCT 40.2 35.5* 36.0*  MCV 87.2 88.1 89.3  PLT 189 147* 156   Cardiac Enzymes:  Recent Labs Lab 11/17/14 0700 11/18/14 0340  CKTOTAL 115 84    Recent Results (from the past 240 hour(s))  CSF culture     Status: None (Preliminary result)   Collection Time: 11/16/14  9:49 PM  Result Value Ref Range Status   Specimen Description BACK  Final   Special Requests NONE  Final   Gram Stain   Final    WBC PRESENT, PREDOMINANTLY MONONUCLEAR NO ORGANISMS SEEN CYTOSPIN SLIDE Gram Stain Report Called to,Read Back By and Verified With: Gram Stain Report Called to,Read Back By and Verified With: TIMED DOSTER AT 2336 ON [id number] BY G KONTOS Performed by [facility] Performed at [facility]    Culture PENDING  Incomplete   Report Status PENDING  Incomplete  Gram stain     Status: None   Collection Time: 11/16/14  9:49 PM  Result Value Ref Range Status   Specimen Description BACK  Final   Special Requests NONE  Final   Gram Stain   Final    CYTOSPIN NO ORGANISMS  SEEN WBC PRESENT, PREDOMINANTLY MONONUCLEAR Gram Stain Report Called to,Read Back By and Verified With: T DOSTER AT 2336 ON 05.25.16 BY G KONTOS    Report Status 11/16/2014 FINAL  Final  MRSA PCR Screening     Status: None   Collection Time: 11/17/14  6:14 AM  Result Value Ref Range Status   MRSA by PCR NEGATIVE NEGATIVE Final    Comment:        The GeneXpert MRSA Assay (FDA approved for NASAL specimens only), is one component of a comprehensive MRSA colonization surveillance program. It is not intended to diagnose MRSA  infection nor to guide or monitor treatment for MRSA infections.   Rapid strep screen (not at [facility])     Status: None   Collection Time: 11/17/14  6:27 AM  Result Value Ref Range Status   Streptococcus, Group A Screen (Direct) NEGATIVE NEGATIVE Final    Comment: (NOTE) A Rapid Antigen test may result negative if the antigen level in the sample is below the detection level of this test. The FDA has not cleared this test as a stand-alone test therefore the rapid antigen negative result has reflexed to a Group A Strep culture.      Scheduled Meds: . heparin  5,000 Units Subcutaneous 3 times per day  . sodium chloride  3 mL Intravenous Q12H  . vancomycin  1,000 mg Intravenous Q8H   Continuous Infusions: . sodium chloride 100 mL (11/18/14 0413)

## 2014-11-18 NOTE — Progress Notes (Signed)
Report called to Belenda CruiseKristin, RN, 4E. Patient will go to CT first before transferring to 4E09. Family members at bedside, will move all belongings to new room. E-Link notified of patient transfer. Asher Muir- Amaira Safley,RN

## 2014-11-19 LAB — BASIC METABOLIC PANEL
ANION GAP: 6 (ref 5–15)
BUN: 7 mg/dL (ref 6–20)
CO2: 27 mmol/L (ref 22–32)
Calcium: 8.3 mg/dL — ABNORMAL LOW (ref 8.9–10.3)
Chloride: 108 mmol/L (ref 101–111)
Creatinine, Ser: 0.74 mg/dL (ref 0.61–1.24)
GFR calc Af Amer: >60 mL/min (ref >60)
GFR calc non Af Amer: >60 mL/min (ref >60)
GLUCOSE: 139 mg/dL — AB (ref 65–99)
Potassium: 3.4 mmol/L — ABNORMAL LOW (ref 3.5–5.1)
Sodium: 141 mmol/L (ref 135–145)

## 2014-11-19 LAB — CBC
HEMATOCRIT: 34.7 % — AB (ref 39.0–52.0)
Hemoglobin: 12.3 g/dL — ABNORMAL LOW (ref 13.0–17.0)
MCH: 30.3 pg (ref 26.0–34.0)
MCHC: 35.4 g/dL (ref 30.0–36.0)
MCV: 85.5 fL (ref 78.0–100.0)
Platelets: 136 10*3/uL — ABNORMAL LOW (ref 150–400)
RBC: 4.06 MIL/uL — ABNORMAL LOW (ref 4.22–5.81)
RDW: 12.9 % (ref 11.5–15.5)
WBC: 5.4 10*3/uL (ref 4.0–10.5)

## 2014-11-19 LAB — CULTURE, GROUP A STREP: Strep A Culture: NEGATIVE

## 2014-11-19 MED ORDER — DOXYCYCLINE HYCLATE 100 MG PO CAPS: 100.0000 mg | ORAL_CAPSULE | Freq: Two times a day (BID) | ORAL | Status: DC

## 2014-11-19 MED ORDER — HYDROMORPHONE HCL 2 MG PO TABS: 2.0000 mg | ORAL_TABLET | ORAL | Status: DC | PRN

## 2014-11-19 MED ORDER — CYCLOBENZAPRINE HCL 10 MG PO TABS: 10.0000 mg | ORAL_TABLET | Freq: Three times a day (TID) | ORAL | Status: DC | PRN

## 2014-11-19 NOTE — Discharge Summary (Addendum)
Physician Discharge Summary  Hector Williamson ASN:[id number] DOB: 07-31-1975 DOA: 11/16/2014  PCP: No primary care provider on file.  Admit date: 11/16/2014 Discharge date: 11/19/2014  Recommendations for Outpatient Follow-up:  1. Pt will need to follow up with PCP in 2-3 weeks post discharge 2. Please obtain BMP to evaluate electrolytes and kidney function  Discharge Diagnoses:  Principal Problem: Viral URI  Discharge Condition: Stable  Diet recommendation: Heart healthy diet discussed in details   Brief narrative:    Pt is 39 yo male with known history of chronic back pain, presented to [facility] ED with main concern of sudden onset of fever up to 104F, chills, malaise, associated with constant throbbing generalized headache and neck pain. Pt has one year old daughter who was recently sick with viral illness and has eventually required ABX, wife also got sick with fevers and required ABX.  Assessment/Plan:    Principal Problem:  Sepsis secondary to unclear source, ? Viral  - criteria for sepsis met on admission with T 105 F, HR 122, RR up to 30 bpm, WBC 12 - 13 K, lactic acid >3 - source is not clear and possibly viral and secondary to contact exposure (family) - per neurologist, CNS unlikely source, meningitis ruled out  - respiratory viral panel negative to date  - CT chest notable for possible PNA but more suggestive of atelectasis - pt to continue empiric doxy upon discharge, he wants to home today  - CT abdomen and pelvis with no specific etiology to explain fever - blood culture pending but no growth to date - Blood pressure stabilized, lactic acid now within normal limits, WBC within normal limits  Active Problems:  Abdominal pain, left upper and lower quadrant, acute - CT abdomen and pelvis notable for ileus and splenomegaly, abdominal exam is unremarkable this morning - No tenderness with palpation in the abdominal area noted on the exam - tolerated regular diet well     Headache and neck pain - likely related to principal problem, ? viral etiology - resolved   Code Status: Full.  Family Communication: plan of care discussed with the patient and wife at bedside  Disposition Plan: Wants to home   IV access:  Peripheral IV  Procedures and diagnostic studies:   Dg Chest 1 View 11/16/2014 No edema or consolidation.   Ct Head Wo Contrast 11/16/2014 Normal head CT with no acute intracranial process identified.   Mr Cervical Spine W Wo Contrast 11/17/2014 No acute abnormality within the cervical spine. 2. Reversal of the normal cervical lordosis with mild multilevel degenerative disc disease as detailed above, most severe at C5-6. No significant canal or foraminal narrowing identified within the cervical spine.   Mr Thoracic Spine W Wo Contrast 11/17/2014 Normal MRI of the thoracic spine. No evidence for infection or cord signal abnormality. No significant degenerative changes identified.   Mr Lumbar Spine W Wo Contrast 11/17/2014 No acute abnormality within the lumbar spine. No evidence for infection. No other significant degenerative changes within the lumbar spine.   Dg Abd 2 Views 11/17/2014 Negative.   Medical Consultants:  Neurology   Other Consultants:  None  IAnti-Infectives:   Vancomycin 5/25 --> 5/27 Rocephin 5/25 --> 5/26 Empiric doxy upon discharge      Discharge Exam: Filed Vitals:   11/19/14 0442  BP: 104/65  Pulse: 64  Temp: 98.3 F (36.8 C)  Resp: 16   Filed Vitals:   11/18/14 1735 11/18/14 2131 11/19/14 0245 11/19/14 0442  BP:  97/76  104/65  Pulse:  64  64  Temp: 99.9 F (37.7 C) 98.2 F (36.8 C) 99.1 F (37.3 C) 98.3 F (36.8 C)  TempSrc: Rectal Oral Rectal Oral  Resp:  16  16  Height:      Weight:    85.911 kg (189 lb 6.4 oz)  SpO2:  91%  95%    General: Pt is alert, follows commands appropriately, not in acute distress Cardiovascular: Regular rate and rhythm, S1/S2  +, no murmurs, no rubs, no gallops Respiratory: Clear to auscultation bilaterally, no wheezing, no crackles, no rhonchi Abdominal: Soft, non tender, non distended, bowel sounds +, no guarding Extremities: no edema, no cyanosis, pulses palpable bilaterally DP and PT Neuro: Grossly nonfocal  Discharge Instructions     Medication List    STOP taking these medications        albuterol 108 (90 BASE) MCG/ACT inhaler  Commonly known as:  PROVENTIL HFA;VENTOLIN HFA     ibuprofen 200 MG tablet  Commonly known as:  ADVIL,MOTRIN      TAKE these medications        cyclobenzaprine 10 MG tablet  Commonly known as:  FLEXERIL  Take 1 tablet (10 mg total) by mouth 3 (three) times daily as needed for muscle spasms.     doxycycline 100 MG capsule  Commonly known as:  VIBRAMYCIN  Take 1 capsule (100 mg total) by mouth 2 (two) times daily.     HYDROmorphone 2 MG tablet  Commonly known as:  DILAUDID  Take 1 tablet (2 mg total) by mouth every 4 (four) hours as needed for severe pain.     lisdexamfetamine 40 MG capsule  Commonly known as:  VYVANSE  Take 40 mg by mouth every morning.           Follow-up Information    Call [name], MD.   Specialty:  Internal Medicine   Why:  As needed call my cell phone [phone]   Contact information:   [address] [place] [address] [phone]        The results of significant diagnostics from this hospitalization (including imaging, microbiology, ancillary and laboratory) are listed below for reference.     Microbiology: Recent Results (from the past 240 hour(s))  Culture, blood (routine x 2)     Status: None (Preliminary result)   Collection Time: 11/16/14  8:40 PM  Result Value Ref Range Status   Specimen Description BLOOD RIGHT ANTECUBITAL  Final   Special Requests BOTTLES DRAWN AEROBIC AND ANAEROBIC 5ML  Final   Culture   Final           BLOOD CULTURE RECEIVED NO GROWTH TO DATE CULTURE WILL BE  HELD FOR 5 DAYS BEFORE ISSUING A FINAL NEGATIVE REPORT Performed at [facility]    Report Status PENDING  Incomplete  Culture, blood (routine x 2)     Status: None (Preliminary result)   Collection Time: 11/16/14  8:40 PM  Result Value Ref Range Status   Specimen Description BLOOD BLOOD LEFT FOREARM  Final   Special Requests BOTTLES DRAWN AEROBIC AND ANAEROBIC 5ML  Final   Culture   Final           BLOOD CULTURE RECEIVED NO GROWTH TO DATE CULTURE WILL BE HELD FOR 5 DAYS BEFORE ISSUING A FINAL NEGATIVE REPORT Note: Culture results may be compromised due to an excessive volume of blood received in culture bottles. Performed at [facility]    Report  Status PENDING  Incomplete  CSF culture     Status: None (Preliminary result)   Collection Time: 11/16/14  9:49 PM  Result Value Ref Range Status   Specimen Description BACK  Final   Special Requests NONE  Final   Gram Stain   Final    WBC PRESENT, PREDOMINANTLY MONONUCLEAR NO ORGANISMS SEEN CYTOSPIN SLIDE Gram Stain Report Called to,Read Back By and Verified With: Gram Stain Report Called to,Read Back By and Verified With: TIMED DOSTER AT 2336 ON [id number] BY G KONTOS Performed by [facility] Performed at [facility]    Culture   Final    NO GROWTH 2 DAYS Performed at [facility]    Report Status PENDING  Incomplete  Gram stain     Status: None   Collection Time: 11/16/14  9:49 PM  Result Value Ref Range Status   Specimen Description BACK  Final   Special Requests NONE  Final   Gram Stain   Final    CYTOSPIN NO ORGANISMS SEEN WBC PRESENT, PREDOMINANTLY MONONUCLEAR Gram Stain Report Called to,Read Back By and Verified With: T DOSTER AT 2336 ON 05.25.16 BY G KONTOS    Report Status 11/16/2014 FINAL  Final  MRSA PCR Screening     Status: None   Collection Time: 11/17/14  6:14 AM  Result Value Ref Range Status   MRSA by PCR NEGATIVE NEGATIVE Final    Comment:        The GeneXpert MRSA  Assay (FDA approved for NASAL specimens only), is one component of a comprehensive MRSA colonization surveillance program. It is not intended to diagnose MRSA infection nor to guide or monitor treatment for MRSA infections.   Rapid strep screen (not at [facility])     Status: None   Collection Time: 11/17/14  6:27 AM  Result Value Ref Range Status   Streptococcus, Group A Screen (Direct) NEGATIVE NEGATIVE Final    Comment: (NOTE) A Rapid Antigen test may result negative if the antigen level in the sample is below the detection level of this test. The FDA has not cleared this test as a stand-alone test therefore the rapid antigen negative result has reflexed to a Group A Strep culture.   Respiratory virus panel     Status: None   Collection Time: 11/17/14 11:10 AM  Result Value Ref Range Status   Respiratory Syncytial Virus A Negative Negative Final   Respiratory Syncytial Virus B Negative Negative Final   Influenza A Negative Negative Final   Influenza B Negative Negative Final   Parainfluenza 1 Negative Negative Final   Parainfluenza 2 Negative Negative Final   Parainfluenza 3 Negative Negative Final   Metapneumovirus Negative Negative Final   Rhinovirus Negative Negative Final   Adenovirus Negative Negative Final    Comment: (NOTE) Performed At: [facility] [place], [place] [id number] Lindon Romp MD [id number]      Labs: Basic Metabolic Panel:  Recent Labs Lab 11/16/14 2050 11/17/14 0700 11/18/14 0340 11/19/14 0914  NA 137 134* 133* 141  K 4.0 3.5 3.9 3.4*  CL 102 103 101 108  CO2 23 24 25 27   GLUCOSE 102* 117* 92 139*  BUN 20 15 8 7   CREATININE 1.16 1.01 0.88 0.74  CALCIUM 9.6 8.0* 7.9* 8.3*   Liver Function Tests:  Recent Labs Lab 11/16/14 2050 11/17/14 0700 11/18/14 0340  AST 33 21 17  ALT 30 26 22   ALKPHOS 60 50 48  BILITOT 1.6*  1.3* 1.1  PROT 7.6 6.3* 6.2*  ALBUMIN 4.5 3.7 3.3*    Recent Labs Lab 11/17/14 0700   LIPASE 15*   No results for input(s): AMMONIA in the last 168 hours. CBC:  Recent Labs Lab 11/16/14 2050 11/17/14 0700 11/18/14 0340 11/19/14 0914  WBC 12.2* 13.8* 8.6 5.4  NEUTROABS 10.7* 11.7*  --   --   HGB 14.2 12.4* 12.4* 12.3*  HCT 40.2 35.5* 36.0* 34.7*  MCV 87.2 88.1 89.3 85.5  PLT 189 147* 156 136*   Cardiac Enzymes:  Recent Labs Lab 11/17/14 0700 11/18/14 0340  CKTOTAL 115 84   SIGNED: Time coordinating discharge: 30 minutes  MAGICK-Evalise Abruzzese, MD  [facility] 11/19/2014, 11:08 AM Pager [phone]  If 7PM-7AM, please contact night-coverage [email or web address] Password [email or web address]

## 2014-11-19 NOTE — Discharge Instructions (Signed)

## 2014-11-20 LAB — CSF CULTURE: CULTURE: NO GROWTH

## 2014-11-20 LAB — CSF CULTURE W GRAM STAIN

## 2014-11-21 LAB — HIV-1 RNA ULTRAQUANT REFLEX TO GENTYP+: HIV-1 RNA Quant, Log: UNDETERMINED log10copy/mL

## 2014-11-23 LAB — CULTURE, BLOOD (ROUTINE X 2)
CULTURE: NO GROWTH
CULTURE: NO GROWTH

## 2015-01-27 ENCOUNTER — Encounter (HOSPITAL_COMMUNITY): Payer: Self-pay | Admitting: Emergency Medicine

## 2015-01-27 ENCOUNTER — Emergency Department (HOSPITAL_COMMUNITY)
Admission: EM | Admit: 2015-01-27 | Discharge: 2015-01-28 | Disposition: A | Discharge status: Home / Self Care | Payer: BLUE CROSS/BLUE SHIELD | Source: Home / Self Care | Attending: Emergency Medicine | Admitting: Emergency Medicine

## 2015-01-27 DIAGNOSIS — Z87891 Personal history of nicotine dependence: Secondary | ICD-10-CM | POA: Insufficient documentation

## 2015-01-27 DIAGNOSIS — J029 Acute pharyngitis, unspecified: Secondary | ICD-10-CM

## 2015-01-27 DIAGNOSIS — R52 Pain, unspecified: Secondary | ICD-10-CM | POA: Insufficient documentation

## 2015-01-27 DIAGNOSIS — Z880 Allergy status to penicillin: Secondary | ICD-10-CM

## 2015-01-27 DIAGNOSIS — Z79899 Other long term (current) drug therapy: Secondary | ICD-10-CM

## 2015-01-27 DIAGNOSIS — R197 Diarrhea, unspecified: Secondary | ICD-10-CM

## 2015-01-27 DIAGNOSIS — R5383 Other fatigue: Secondary | ICD-10-CM

## 2015-01-27 DIAGNOSIS — M791 Myalgia: Secondary | ICD-10-CM

## 2015-01-27 DIAGNOSIS — R000 Tachycardia, unspecified: Secondary | ICD-10-CM | POA: Insufficient documentation

## 2015-01-27 DIAGNOSIS — R509 Fever, unspecified: Secondary | ICD-10-CM | POA: Diagnosis not present

## 2015-01-27 LAB — CBC WITH DIFFERENTIAL/PLATELET
BASOS ABS: 0 10*3/uL (ref 0.0–0.1)
Basophils Relative: 1 % (ref 0–1)
EOS ABS: 0.1 10*3/uL (ref 0.0–0.7)
Eosinophils Relative: 2 % (ref 0–5)
HCT: 42 % (ref 39.0–52.0)
HEMOGLOBIN: 15.2 g/dL (ref 13.0–17.0)
LYMPHS ABS: 0.7 10*3/uL (ref 0.7–4.0)
Lymphocytes Relative: 12 % (ref 12–46)
MCH: 31.1 pg (ref 26.0–34.0)
MCHC: 36.2 g/dL — AB (ref 30.0–36.0)
MCV: 86.1 fL (ref 78.0–100.0)
MONO ABS: 0.3 10*3/uL (ref 0.1–1.0)
MONOS PCT: 5 % (ref 3–12)
NEUTROS PCT: 80 % — AB (ref 43–77)
Neutro Abs: 4.5 10*3/uL (ref 1.7–7.7)
Platelets: 183 10*3/uL (ref 150–400)
RBC: 4.88 MIL/uL (ref 4.22–5.81)
RDW: 13.1 % (ref 11.5–15.5)
WBC: 5.6 10*3/uL (ref 4.0–10.5)

## 2015-01-27 LAB — COMPREHENSIVE METABOLIC PANEL
ALK PHOS: 80 U/L (ref 38–126)
ALT: 55 U/L (ref 17–63)
AST: 48 U/L — AB (ref 15–41)
Albumin: 4.4 g/dL (ref 3.5–5.0)
Anion gap: 12 (ref 5–15)
BUN: 11 mg/dL (ref 6–20)
CALCIUM: 9.3 mg/dL (ref 8.9–10.3)
CHLORIDE: 103 mmol/L (ref 101–111)
CO2: 25 mmol/L (ref 22–32)
Creatinine, Ser: 1 mg/dL (ref 0.61–1.24)
GFR calc Af Amer: >60 mL/min (ref >60)
GLUCOSE: 106 mg/dL — AB (ref 65–99)
Potassium: 3.3 mmol/L — ABNORMAL LOW (ref 3.5–5.1)
SODIUM: 140 mmol/L (ref 135–145)
Total Bilirubin: 0.9 mg/dL (ref 0.3–1.2)
Total Protein: 7.9 g/dL (ref 6.5–8.1)

## 2015-01-27 LAB — I-STAT CG4 LACTIC ACID, ED: Lactic Acid, Venous: 1.57 mmol/L (ref 0.5–2.0)

## 2015-01-27 MED ORDER — ACETAMINOPHEN 325 MG PO TABS
650.0000 mg | ORAL_TABLET | Freq: Once | ORAL | Status: AC
  Administered 2015-01-28: 650 mg via ORAL
  Filled 2015-01-27: qty 2

## 2015-01-27 MED ORDER — KETOROLAC TROMETHAMINE 30 MG/ML IJ SOLN
30.0000 mg | Freq: Once | INTRAMUSCULAR | Status: AC
  Administered 2015-01-28: 30 mg via INTRAVENOUS
  Filled 2015-01-27: qty 1

## 2015-01-27 NOTE — ED Notes (Signed)
Phlebotomist noted that pt became pale and diaphoretic during blood draw, pt placed in trendelnberg position, pt color returned. Pt states "I feel bad, my body aches are coming back." IV placed by this RN.

## 2015-01-27 NOTE — ED Provider Notes (Signed)
CSN: [id number]     Arrival date & time 01/27/15  2228 History  This chart was scribed for Blane Ohara, MD by Freida Busman, ED Scribe. This patient was seen in room D31C/D31C and the patient's care was started 11:50 PM.    Chief Complaint  Patient presents with  . Fatigue  . Fever   The history is provided by the patient. No language interpreter was used.     HPI Comments:  JOACHIM CARTON is a 39 y.o. male who presents to the Emergency Department complaining of generalized body aches which has been constant since waking this am and has progressively worsened since. He reports associated fever with max temp of 100.8, diarrhea, and sore throat. He has taken tylenol and ibuprofen throughout the day with little relief. His last dose of ibuprofen was ~1800. He also took dilaudid ~1900 with mild relief. Pt notes he was admitted to the  ICU ~ 2 months ago for "unknown viral infection". He states pain to day is different, states with that episodes he experienced extreme muscle spasms. Pt denies recent travel outside of the country, infectious history-HIV, herpes and denies new medication. Pt notes he ran out of vyvanse 5 days ago He has been taking it for ~4-5 years for ADHD. He also denies vomiting, urinary symptoms, cough, and HA.     Past Medical History  Diagnosis Date  . Back pain     spasms   History reviewed. No pertinent past surgical history. History reviewed. No pertinent family history. History  Substance Use Topics  . Smoking status: Former Smoker    Types: Cigarettes    Quit date: 11/16/1999  . Smokeless tobacco: Former Neurosurgeon    Quit date: 11/15/1997  . Alcohol Use: Yes     Comment: occassionally    Review of Systems  Constitutional: Positive for fever.  HENT: Positive for sore throat.   Respiratory: Negative for cough.   Gastrointestinal: Positive for diarrhea. Negative for vomiting.  Genitourinary: Negative for dysuria and difficulty urinating.  Musculoskeletal:  Positive for myalgias and arthralgias.  Neurological: Negative for headaches.  All other systems reviewed and are negative.     Allergies  Penicillins  Home Medications   Prior to Admission medications   Medication Sig Start Date End Date Taking? Authorizing Provider  Ginkgo Biloba 40 MG TABS Take 1 tablet by mouth daily.   Yes Historical Provider, MD  HYDROmorphone (DILAUDID) 2 MG tablet Take 1 tablet (2 mg total) by mouth every 4 (four) hours as needed for severe pain. 11/19/14  Yes Dorothea Ogle, MD  lisdexamfetamine (VYVANSE) 40 MG capsule Take 40 mg by mouth every morning.   Yes Historical Provider, MD  Omega-3 Fatty Acids (FISH OIL PO) Take 1 capsule by mouth daily.   Yes Historical Provider, MD   BP 105/68 mmHg  Pulse 92  Temp(Src) 101.6 F (38.7 C) (Rectal)  Resp 18  SpO2 96% Physical Exam  Constitutional: He is oriented to person, place, and time. He appears well-developed and well-nourished.  HENT:  Head: Normocephalic and atraumatic.  Mouth/Throat: No oropharyngeal exudate.  Mild DMM No exudate    Eyes: Conjunctivae are normal. Pupils are equal, round, and reactive to light.  Neck: Normal range of motion. Neck supple.  No meningismus  Cardiovascular: Regular rhythm and normal heart sounds.   No murmur heard. Mild Tachycardia   Pulmonary/Chest: Effort normal.  Abdominal: Soft. He exhibits no distension.  No Peritonitis   Musculoskeletal: He exhibits tenderness.  Generalized  muscle TTP  Neurological: He is alert and oriented to person, place, and time.  Skin: Skin is warm and dry. No rash noted.  No rash to feet, legs, palms No petechiae or purpura   Psychiatric: He has a normal mood and affect.  Nursing note and vitals reviewed.   ED Course  Procedures   DIAGNOSTIC STUDIES:  Oxygen Saturation is 95% on RA, normal by my interpretation.    COORDINATION OF CARE:  11:59 PM Partial results given.  Discussed treatment plan with pt at bedside and pt  agreed to plan.  Labs Review Labs Reviewed  COMPREHENSIVE METABOLIC PANEL - Abnormal; Notable for the following:    Potassium 3.3 (*)    Glucose, Bld 106 (*)    AST 48 (*)    All other components within normal limits  CBC WITH DIFFERENTIAL/PLATELET - Abnormal; Notable for the following:    MCHC 36.2 (*)    Neutrophils Relative % 80 (*)    All other components within normal limits  CULTURE, BLOOD (ROUTINE X 2)  CULTURE, BLOOD (ROUTINE X 2)  URINE CULTURE  RAPID STREP SCREEN (NOT AT [facility])  CK  URINALYSIS, ROUTINE W REFLEX MICROSCOPIC (NOT AT [facility])  I-STAT CG4 LACTIC ACID, ED    Imaging Review No results found.   EKG Interpretation   Date/Time:  Friday January 27 2015 23:02:24 EDT Ventricular Rate:  91 PR Interval:  158 QRS Duration: 86 QT Interval:  336 QTC Calculation: 413 R Axis:   90 Text Interpretation:  Normal sinus rhythm Rightward axis Borderline ECG  Confirmed by Lamara Brecht  MD, Tecla Mailloux ([id number]) on 01/27/2015 11:11:41 PM      MDM   Final diagnoses:  Body aches  Fever and chills   Patient presents with viral-like symptoms bodyaches fever chills. Patient well-appearing on exam, no meningismus, diffuse myalgias/tenderness to muscles with normal CK. Normal lactate. Vitals unremarkable except for fever as mild tachycardia resolved. Multiple fluid boluses, antipyretics and Toradol. Considered withdrawal from Vyvanse however he stopped on Monday.   Past smoker. Patient had recent admission for milder version of similar. Patient understands strict reasons to return, no indication for admission at this time. No concerning rashes on exam.  Pt improved on recheck.  No signs of SBI currently. Results and differential diagnosis were discussed with the patient/parent/guardian. Xrays were independently reviewed by myself.  Close follow up outpatient was discussed, comfortable with the plan.   Medications  sodium chloride 0.9 % bolus 2,000 mL (not administered)  ketorolac  (TORADOL) 30 MG/ML injection 30 mg (30 mg Intravenous Given 01/28/15 0039)  acetaminophen (TYLENOL) tablet 650 mg (650 mg Oral Given 01/28/15 0039)    Filed Vitals:   01/27/15 2326 01/27/15 2330 01/28/15 0000 01/28/15 0030  BP:  107/66 119/74 105/68  Pulse:  81 94 92  Temp: 101.6 F (38.7 C)     TempSrc: Rectal     Resp:  19 22 18   SpO2:  95% 100% 96%    Final diagnoses:  Body aches  Fever and chills      Blane Ohara, MD 01/28/15 [phone]

## 2015-01-27 NOTE — ED Notes (Signed)
Pt from home for eval of generalized body aches that started today, pt states he also has been running a fever. Pt took 500 mg of ibuprofen and  of dilaudid at 1800 for pain. Pt states was seen 2 months ago and was admitted for similar symptoms and sepsis. Pt denies any n/v but reports 4 episodes of diarrhea yesterday. Pt in nad at this time.

## 2015-01-28 ENCOUNTER — Inpatient Hospital Stay (HOSPITAL_COMMUNITY)
Admission: EM | Admit: 2015-01-28 | Discharge: 2015-01-31 | DRG: 864 | Disposition: A | Discharge status: Home / Self Care | Payer: BLUE CROSS/BLUE SHIELD | Attending: Oncology | Admitting: Oncology

## 2015-01-28 ENCOUNTER — Inpatient Hospital Stay (HOSPITAL_COMMUNITY): Payer: BLUE CROSS/BLUE SHIELD

## 2015-01-28 ENCOUNTER — Encounter (HOSPITAL_COMMUNITY): Payer: Self-pay | Admitting: *Deleted

## 2015-01-28 ENCOUNTER — Emergency Department (HOSPITAL_COMMUNITY): Payer: BLUE CROSS/BLUE SHIELD

## 2015-01-28 DIAGNOSIS — M25511 Pain in right shoulder: Secondary | ICD-10-CM | POA: Diagnosis present

## 2015-01-28 DIAGNOSIS — K029 Dental caries, unspecified: Secondary | ICD-10-CM | POA: Diagnosis present

## 2015-01-28 DIAGNOSIS — Z87891 Personal history of nicotine dependence: Secondary | ICD-10-CM | POA: Diagnosis not present

## 2015-01-28 DIAGNOSIS — K0889 Other specified disorders of teeth and supporting structures: Secondary | ICD-10-CM | POA: Insufficient documentation

## 2015-01-28 DIAGNOSIS — R509 Fever, unspecified: Principal | ICD-10-CM | POA: Diagnosis present

## 2015-01-28 DIAGNOSIS — M2550 Pain in unspecified joint: Secondary | ICD-10-CM | POA: Insufficient documentation

## 2015-01-28 DIAGNOSIS — R748 Abnormal levels of other serum enzymes: Secondary | ICD-10-CM | POA: Diagnosis present

## 2015-01-28 DIAGNOSIS — R7989 Other specified abnormal findings of blood chemistry: Secondary | ICD-10-CM

## 2015-01-28 DIAGNOSIS — K088 Other specified disorders of teeth and supporting structures: Secondary | ICD-10-CM | POA: Diagnosis not present

## 2015-01-28 DIAGNOSIS — M791 Myalgia, unspecified site: Secondary | ICD-10-CM | POA: Diagnosis present

## 2015-01-28 DIAGNOSIS — A419 Sepsis, unspecified organism: Secondary | ICD-10-CM

## 2015-01-28 DIAGNOSIS — R778 Other specified abnormalities of plasma proteins: Secondary | ICD-10-CM | POA: Diagnosis present

## 2015-01-28 DIAGNOSIS — I959 Hypotension, unspecified: Secondary | ICD-10-CM | POA: Diagnosis present

## 2015-01-28 HISTORY — DX: Attention-deficit hyperactivity disorder, unspecified type: F90.9

## 2015-01-28 LAB — CK: Total CK: 82 U/L (ref 49–397)

## 2015-01-28 LAB — URINALYSIS, ROUTINE W REFLEX MICROSCOPIC
BILIRUBIN URINE: NEGATIVE
Glucose, UA: NEGATIVE mg/dL
HGB URINE DIPSTICK: NEGATIVE
Ketones, ur: NEGATIVE mg/dL
Leukocytes, UA: NEGATIVE
Nitrite: NEGATIVE
Protein, ur: NEGATIVE mg/dL
Specific Gravity, Urine: 1.017 (ref 1.005–1.030)
UROBILINOGEN UA: 0.2 mg/dL (ref 0.0–1.0)
pH: 6.5 (ref 5.0–8.0)

## 2015-01-28 LAB — CBC WITH DIFFERENTIAL/PLATELET
Basophils Absolute: 0 10*3/uL (ref 0.0–0.1)
Basophils Relative: 0 % (ref 0–1)
EOS ABS: 0.1 10*3/uL (ref 0.0–0.7)
Eosinophils Relative: 1 % (ref 0–5)
HCT: 37.6 % — ABNORMAL LOW (ref 39.0–52.0)
Hemoglobin: 13.4 g/dL (ref 13.0–17.0)
LYMPHS ABS: 0.6 10*3/uL — AB (ref 0.7–4.0)
Lymphocytes Relative: 11 % — ABNORMAL LOW (ref 12–46)
MCH: 30.5 pg (ref 26.0–34.0)
MCHC: 35.6 g/dL (ref 30.0–36.0)
MCV: 85.5 fL (ref 78.0–100.0)
MONO ABS: 0.5 10*3/uL (ref 0.1–1.0)
MONOS PCT: 9 % (ref 3–12)
Neutro Abs: 4.3 10*3/uL (ref 1.7–7.7)
Neutrophils Relative %: 79 % — ABNORMAL HIGH (ref 43–77)
Platelets: 160 10*3/uL (ref 150–400)
RBC: 4.4 MIL/uL (ref 4.22–5.81)
RDW: 13.2 % (ref 11.5–15.5)
WBC: 5.4 10*3/uL (ref 4.0–10.5)

## 2015-01-28 LAB — COMPREHENSIVE METABOLIC PANEL
ALT: 48 U/L (ref 17–63)
AST: 41 U/L (ref 15–41)
Albumin: 3.7 g/dL (ref 3.5–5.0)
Alkaline Phosphatase: 60 U/L (ref 38–126)
Anion gap: 10 (ref 5–15)
BUN: 9 mg/dL (ref 6–20)
CHLORIDE: 105 mmol/L (ref 101–111)
CO2: 22 mmol/L (ref 22–32)
Calcium: 8.7 mg/dL — ABNORMAL LOW (ref 8.9–10.3)
Creatinine, Ser: 0.97 mg/dL (ref 0.61–1.24)
GFR calc non Af Amer: >60 mL/min (ref >60)
Glucose, Bld: 106 mg/dL — ABNORMAL HIGH (ref 65–99)
Potassium: 3.4 mmol/L — ABNORMAL LOW (ref 3.5–5.1)
Sodium: 137 mmol/L (ref 135–145)
TOTAL PROTEIN: 6.2 g/dL — AB (ref 6.5–8.1)
Total Bilirubin: 1.3 mg/dL — ABNORMAL HIGH (ref 0.3–1.2)

## 2015-01-28 LAB — I-STAT CG4 LACTIC ACID, ED
LACTIC ACID, VENOUS: 1.02 mmol/L (ref 0.5–2.0)
LACTIC ACID, VENOUS: 1.26 mmol/L (ref 0.5–2.0)

## 2015-01-28 LAB — FERRITIN: Ferritin: 127 ng/mL (ref 24–336)

## 2015-01-28 LAB — MRSA PCR SCREENING: MRSA BY PCR: NEGATIVE

## 2015-01-28 LAB — TROPONIN I

## 2015-01-28 LAB — C-REACTIVE PROTEIN: CRP: 2.6 mg/dL — ABNORMAL HIGH (ref <1.0)

## 2015-01-28 LAB — SEDIMENTATION RATE: Sed Rate: 17 mm/hr — ABNORMAL HIGH (ref 0–16)

## 2015-01-28 LAB — RAPID STREP SCREEN (MED CTR MEBANE ONLY): Streptococcus, Group A Screen (Direct): NEGATIVE

## 2015-01-28 LAB — MONONUCLEOSIS SCREEN: Mono Screen: NEGATIVE

## 2015-01-28 MED ORDER — IOHEXOL 300 MG/ML  SOLN
100.0000 mL | Freq: Once | INTRAMUSCULAR | Status: AC | PRN
  Administered 2015-01-28: 100 mL via INTRAVENOUS

## 2015-01-28 MED ORDER — ACETAMINOPHEN 325 MG PO TABS
650.0000 mg | ORAL_TABLET | Freq: Four times a day (QID) | ORAL | Status: DC | PRN
  Administered 2015-01-28: 650 mg via ORAL
  Filled 2015-01-28: qty 2

## 2015-01-28 MED ORDER — IOHEXOL 300 MG/ML  SOLN: 25.0000 mL | Freq: Once | INTRAMUSCULAR | Status: AC | PRN

## 2015-01-28 MED ORDER — LORAZEPAM 2 MG/ML IJ SOLN: 1.0000 mg | Freq: Once | INTRAMUSCULAR | Status: DC

## 2015-01-28 MED ORDER — DEXTROSE 5 % IV SOLN
1.0000 g | Freq: Once | INTRAVENOUS | Status: DC
  Administered 2015-01-28: 1 g via INTRAVENOUS
  Filled 2015-01-28: qty 1

## 2015-01-28 MED ORDER — SODIUM CHLORIDE 0.9 % IV BOLUS (SEPSIS)
1000.0000 mL | Freq: Once | INTRAVENOUS | Status: AC
  Administered 2015-01-28: 1000 mL via INTRAVENOUS

## 2015-01-28 MED ORDER — ENOXAPARIN SODIUM 40 MG/0.4ML ~~LOC~~ SOLN
40.0000 mg | SUBCUTANEOUS | Status: DC
  Administered 2015-01-28 – 2015-01-30 (×3): 40 mg via SUBCUTANEOUS
  Filled 2015-01-28 (×4): qty 0.4

## 2015-01-28 MED ORDER — HYDROCODONE-ACETAMINOPHEN 5-325 MG PO TABS
1.0000 | ORAL_TABLET | ORAL | Status: DC | PRN
  Administered 2015-01-28 – 2015-01-29 (×3): 1 via ORAL
  Filled 2015-01-28 (×3): qty 1

## 2015-01-28 MED ORDER — CEFTAZIDIME 1 G IJ SOLR: 1.0000 g | Freq: Once | INTRAMUSCULAR | Status: DC

## 2015-01-28 MED ORDER — ACETAMINOPHEN 500 MG PO TABS
500.0000 mg | ORAL_TABLET | Freq: Once | ORAL | Status: AC
  Administered 2015-01-28: 500 mg via ORAL
  Filled 2015-01-28: qty 1

## 2015-01-28 MED ORDER — ACETAMINOPHEN 650 MG RE SUPP: 650.0000 mg | Freq: Four times a day (QID) | RECTAL | Status: DC | PRN

## 2015-01-28 MED ORDER — SODIUM CHLORIDE 0.9 % IV SOLN
INTRAVENOUS | Status: AC
  Administered 2015-01-28: 125 mL/h via INTRAVENOUS

## 2015-01-28 MED ORDER — ACETAMINOPHEN 80 MG RE SUPP
500.0000 mg | Freq: Four times a day (QID) | RECTAL | Status: DC | PRN
  Filled 2015-01-28: qty 1

## 2015-01-28 MED ORDER — SODIUM CHLORIDE 0.9 % IJ SOLN
3.0000 mL | Freq: Two times a day (BID) | INTRAMUSCULAR | Status: DC
Start: 2015-01-28 — End: 2015-01-31
  Administered 2015-01-28 – 2015-01-31 (×4): 3 mL via INTRAVENOUS

## 2015-01-28 MED ORDER — VANCOMYCIN HCL IN DEXTROSE 1-5 GM/200ML-% IV SOLN
1000.0000 mg | Freq: Once | INTRAVENOUS | Status: AC
  Administered 2015-01-28: 1000 mg via INTRAVENOUS
  Filled 2015-01-28: qty 200

## 2015-01-28 MED ORDER — HYDROMORPHONE HCL 1 MG/ML IJ SOLN
1.0000 mg | Freq: Once | INTRAMUSCULAR | Status: AC
  Administered 2015-01-28: 1 mg via INTRAVENOUS
  Filled 2015-01-28: qty 1

## 2015-01-28 MED ORDER — SODIUM CHLORIDE 0.9 % IV BOLUS (SEPSIS)
2000.0000 mL | Freq: Once | INTRAVENOUS | Status: AC
  Administered 2015-01-28: 2000 mL via INTRAVENOUS

## 2015-01-28 MED ORDER — IBUPROFEN 200 MG PO TABS
400.0000 mg | ORAL_TABLET | Freq: Once | ORAL | Status: AC
  Administered 2015-01-28: 400 mg via ORAL
  Filled 2015-01-28: qty 2

## 2015-01-28 MED ORDER — ACETAMINOPHEN 500 MG PO TABS
500.0000 mg | ORAL_TABLET | Freq: Four times a day (QID) | ORAL | Status: DC | PRN
  Administered 2015-01-29 – 2015-01-31 (×4): 500 mg via ORAL
  Filled 2015-01-28 (×4): qty 1

## 2015-01-28 MED ORDER — ALBUTEROL SULFATE (2.5 MG/3ML) 0.083% IN NEBU
5.0000 mg | INHALATION_SOLUTION | Freq: Once | RESPIRATORY_TRACT | Status: AC
  Administered 2015-01-28: 5 mg via RESPIRATORY_TRACT
  Filled 2015-01-28: qty 6

## 2015-01-28 MED ORDER — SODIUM CHLORIDE 0.9 % IV BOLUS (SEPSIS)
2000.0000 mL | Freq: Once | INTRAVENOUS | Status: AC
  Administered 2015-01-27: 2000 mL via INTRAVENOUS

## 2015-01-28 NOTE — ED Notes (Signed)
Pt was seen here last night for same. Having fever and n/v, fatigue. Now feels sob and feels worse, "feels like bp is low." bp is 108/66 on arrival.

## 2015-01-28 NOTE — H&P (Signed)
Date: 01/28/2015               Patient Name:  Hector Williamson MRN: [id number]  DOB: [birth year] Age / Sex: 39 y.o., male   PCP: No primary care provider on file.              Medical Service: Internal Medicine Teaching Service              Attending Physician: Dr. Bartholomew Crews, MD    First Contact: Sandria Manly, MS 3 Pager: [phone]  Second Contact: Dr. Zada Finders Pager: [phone]  Third Contact Dr. Joni Reining Pager: [phone]       After Hours (After 5p/  First Contact Pager: [phone]  weekends / holidays): Second Contact Pager: [phone]   Chief Complaint: Fever and myalgia  History of Present Illness: Hector Williamson is a 39 y.o. male who  has a past medical history of Back pain and ADHD (attention deficit hyperactivity disorder). who presents today with a two day history of myalgias and fever. The patient stated that on Thursday, he experienced diarrhea most of the day but felt well otherwise. Yesterday morning he awoke with body aches that were unresponsive to ibuprofen. Around 3:00 pm he noted that he started feeling feverish. He took a nap that afternoon and felt even worse upon awakening. At this point he went to the ED, where he was worked up and sent home and told to take oral acetaminophen. This morning her woke up with even worse myalgias long with nausea and a headache. He did not vomit. He came to the ED to be evaluated again. Of note, he was admitted two months ago and stayed in the ICU for 4 days due to similar symptoms. No definitive diagnosis was given at that time. He also endorsed new onset R shoulder, hip, and ankle pain. He described the pain as "stabbing" in nature and only on the R side. He additionally notes R molar pain due to wisdom teeth, but no recent dental work. No other complaints.  The patient denies any sick contacts or recent travel for himself or his wife and 26.59 year old child. He did serve in [employer] 20 years ago. He works as an Materials engineer and  has not spent much time working in the yard or outdoors recently. No known insect bites. He has two pet dogs who are up to date on all vaccines and live indoors. He is in a monogamous relationship with his wife, last sexual contact approximately one month ago. Unknown family history due to adoption. Distant past use of cigarettes, occasional alcohol use, and no other drug use.   Meds: Current Facility-Administered Medications  Medication Dose Route Frequency Provider Last Rate Last Dose  . iohexol (OMNIPAQUE) 300 MG/ML solution 25 mL  25 mL Oral Once PRN Medication Radiologist, MD        Allergies: Allergies as of 01/28/2015 - Review Complete 01/28/2015  Allergen Reaction Noted  . Penicillins Other (See Comments) 06/08/2011   Past Medical History  Diagnosis Date  . Back pain     spasms  . ADHD (attention deficit hyperactivity disorder)    History reviewed. No pertinent past surgical history. History reviewed. No pertinent family history. History   Social History  . Marital Status: Single    Spouse Name: N/A  . Number of Children: N/A  . Years of Education: N/A   Occupational History  . Not on file.   Social History Main Topics  .  Smoking status: Former Smoker    Types: Cigarettes    Quit date: 11/16/1999  . Smokeless tobacco: Former Systems developer    Quit date: 11/15/1997  . Alcohol Use: Yes     Comment: occassionally  . Drug Use: No     Comment: former user: marijuana, cocaine; last used 2007  . Sexual Activity: Not on file   Other Topics Concern  . Not on file   Social History Narrative   Adopted does not know birth parents.  Served 4 years in the [employer], predominately in [place] but did 2 tours in [place].  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  Married, 1 child, moved to [place] from [place] in 2003.    Review of Systems:  CONSTITUTIONAL:  Fever, chills night sweats, general malaise. Also feeling pre-syncope. Gaining weight since illness in May.  HEENT:  Headache, tooth pain in R molars, sore throat. No vision changes, hearing changes, no sinus tenderness or congestion RESPIRATORY:  No difficulty breathing CARDIOVASCULAR:  Transiet episode of dull chest pain, palpitations GASTROINTESTINAL:  Nausea, diarrhea. No constipation, vomiting, melena GENITOURINARY: No urination changes, blood in urine, MUSCULOSKELETAL: Diffuse myalgia, Limited ROM in R shoulder. No back spasms NEUROLOGICAL: Pins and needle sensation in extremities. No numbness SKIN:  No rashes or lesions ENDOCRINE:  No changes in urinary habits   Physical Exam: Blood pressure 100/57, pulse 99, temperature 101.1 F (38.4 C), temperature source Rectal, resp. rate 17, height 6' (1.829 m), weight 87.091 kg (192 lb), SpO2 99 %. BP 100/57 mmHg  Pulse 99  Temp(Src) 101.1 F (38.4 C) (Rectal)  Resp 17  Ht 6' (1.829 m)  Wt 87.091 kg (192 lb)  BMI 26.03 kg/m2  SpO2 99%  General Appearance:    Alert, cooperative, mild distress, appears stated age  Head:    Normocephalic, without obvious abnormality, atraumatic  Eyes:    PERRL, conjunctiva/corneas clear, EOM's intact  Throat:   Lips, mucosa, and tongue normal; teeth and gums normal  Neck:   Supple, symmetrical, trachea midline, no adenopathy;       Thyroid:  No enlargement/tenderness/nodules; no JVD  Back:     Symmetric, no curvature, ROM normal  Lungs:     Clear to auscultation bilaterally, respirations unlabored  Chest wall:    No tenderness or deformity  Heart:    Regular rate and rhythm, S1 and S2 normal, no murmur, rub   or gallop  Abdomen:     Soft, non-tender, bowel sounds active all four quadrants  Extremities:   Extremities atraumatic, limited passive and active ROM in R   Shoulder, R ankle warmer than L,  no cyanosis or edema  Pulses:   2+ and symmetric all extremities  Skin:   Skin color, texture, turgor normal, no rashes or lesions  Lymph nodes:   Cervical nodes normal    Lab results: Results for orders placed or  performed during the hospital encounter of 01/28/15 (from the past 24 hour(s))  CBC with Differential/Platelet     Status: Abnormal   Collection Time: 01/28/15  9:06 AM  Result Value Ref Range   WBC 5.4 4.0 - 10.5 K/uL   RBC 4.40 4.22 - 5.81 MIL/uL   Hemoglobin 13.4 13.0 - 17.0 g/dL   HCT 37.6 (L) 39.0 - 52.0 %   MCV 85.5 78.0 - 100.0 fL   MCH 30.5 26.0 - 34.0 pg   MCHC 35.6 30.0 - 36.0 g/dL   RDW 13.2 11.5 - 15.5 %  Platelets 160 150 - 400 K/uL   Neutrophils Relative % 79 (H) 43 - 77 %   Neutro Abs 4.3 1.7 - 7.7 K/uL   Lymphocytes Relative 11 (L) 12 - 46 %   Lymphs Abs 0.6 (L) 0.7 - 4.0 K/uL   Monocytes Relative 9 3 - 12 %   Monocytes Absolute 0.5 0.1 - 1.0 K/uL   Eosinophils Relative 1 0 - 5 %   Eosinophils Absolute 0.1 0.0 - 0.7 K/uL   Basophils Relative 0 0 - 1 %   Basophils Absolute 0.0 0.0 - 0.1 K/uL  Comprehensive metabolic panel     Status: Abnormal   Collection Time: 01/28/15  9:06 AM  Result Value Ref Range   Sodium 137 135 - 145 mmol/L   Potassium 3.4 (L) 3.5 - 5.1 mmol/L   Chloride 105 101 - 111 mmol/L   CO2 22 22 - 32 mmol/L   Glucose, Bld 106 (H) 65 - 99 mg/dL   BUN 9 6 - 20 mg/dL   Creatinine, Ser 0.97 0.61 - 1.24 mg/dL   Calcium 8.7 (L) 8.9 - 10.3 mg/dL   Total Protein 6.2 (L) 6.5 - 8.1 g/dL   Albumin 3.7 3.5 - 5.0 g/dL   AST 41 15 - 41 U/L   ALT 48 17 - 63 U/L   Alkaline Phosphatase 60 38 - 126 U/L   Total Bilirubin 1.3 (H) 0.3 - 1.2 mg/dL   GFR calc non Af Amer >60 >60 mL/min   GFR calc Af Amer >60 >60 mL/min   Anion gap 10 5 - 15  Troponin I     Status: None   Collection Time: 01/28/15  9:06 AM  Result Value Ref Range   Troponin I <0.03 <0.031 ng/mL  Mononucleosis screen     Status: None   Collection Time: 01/28/15  9:06 AM  Result Value Ref Range   Mono Screen NEGATIVE NEGATIVE  I-Stat CG4 Lactic Acid, ED     Status: None   Collection Time: 01/28/15  9:25 AM  Result Value Ref Range   Lactic Acid, Venous 1.26 0.5 - 2.0 mmol/L    Imaging  results:  Dg Orthopantogram  01/28/2015   CLINICAL DATA:  Chronic right upper jaw pain.  EXAM: ORTHOPANTOGRAM/PANORAMIC  COMPARISON:  None.  FINDINGS: Teeth 1, 17 and 32 have been extracted. Retained tooth 16 appears impacted or barely erupted. There may be a very subtle caries involving tooth 2. No periapical lucencies identified. Other teeth appear unremarkable with evidence of prior dental hardware and several root canals. Or mandible and maxilla are unremarkable. No bony or dental mass lesions or cysts identified.  IMPRESSION: Remaining wisdom molar, tooth 16, appears impacted or barely erupted. There may be subtle dental caries involving tooth 2.   Electronically Signed   By: Aletta Edouard M.D.   On: 01/28/2015 13:23   Dg Chest 2 View  01/28/2015   CLINICAL DATA:  Fever and right-sided chest pain  EXAM: CHEST  2 VIEW  COMPARISON:  11/16/2014  FINDINGS: There is minimal linear scarring or atelectasis in the lateral left base. The lungs are otherwise clear. There is no pleural effusion. Pulmonary vasculature is normal. Hilar and mediastinal contours are unremarkable and unchanged.  IMPRESSION: Minimal linear scarring or atelectasis in the lateral left base.   Electronically Signed   By: Andreas Newport M.D.   On: 01/28/2015 00:57   Dg Shoulder Right  01/28/2015   CLINICAL DATA:  39 year old male with history of right-sided  shoulder pain yesterday evening, with some associated chills, nausea and fever.  EXAM: RIGHT SHOULDER - 2+ VIEW  COMPARISON:  No priors.  FINDINGS: There is no evidence of fracture or dislocation. There is no evidence of arthropathy or other focal bone abnormality. Soft tissues are unremarkable.  IMPRESSION: Negative.   Electronically Signed   By: Vinnie Langton M.D.   On: 01/28/2015 13:17   Ct Chest W Contrast  01/28/2015   CLINICAL DATA:  39 year old male with fever, body aches, nausea and diarrhea.  EXAM: CT CHEST, ABDOMEN, AND PELVIS WITH CONTRAST  TECHNIQUE: Multidetector CT  imaging of the chest, abdomen and pelvis was performed following the standard protocol during bolus administration of intravenous contrast.  CONTRAST:  134m OMNIPAQUE IOHEXOL 300 MG/ML  SOLN  COMPARISON:  CT of the chest, CT of the chest 11/18/2014. CT of the abdomen and pelvis 11/17/2014.  FINDINGS: CT CHEST FINDINGS  Mediastinum/Lymph Nodes: Heart size is normal. There is no significant pericardial fluid, thickening or pericardial calcification. No pathologically enlarged mediastinal or hilar lymph nodes. Esophagus is unremarkable in appearance. No axillary lymphadenopathy.  Lungs/Pleura: Linear opacities in the left lung base favored to represent areas of scarring. No acute consolidative airspace disease. No pleural effusions. No suspicious appearing pulmonary nodules or masses.  Musculoskeletal/Soft Tissues: There are no aggressive appearing lytic or blastic lesions noted in the visualized portions of the skeleton.  CT ABDOMEN AND PELVIS FINDINGS  Hepatobiliary: Diffuse low attenuation throughout the hepatic parenchyma, compatible with hepatic steatosis. No cystic or solid hepatic lesions. No intra or extrahepatic biliary ductal dilatation. Gallbladder is normal in appearance.  Pancreas: No pancreatic mass. No pancreatic ductal dilatation. No pancreatic or peripancreatic fluid or inflammatory changes.  Spleen: Spleen remains enlarged measuring 15.5 x 4.9 x 12.2 cm (estimated splenic volume of 463 mL).  Adrenals/Urinary Tract: Bilateral adrenal glands and bilateral kidneys are normal in appearance. No hydroureteronephrosis or perinephric stranding to indicate urinary tract obstruction at this time. Urinary bladder is normal in appearance.  Stomach/Bowel: Normal appearance of the stomach. No pathologic dilatation of small bowel or colon. Normal appendix.  Vascular/Lymphatic: No significant atherosclerotic disease, aneurysm or dissection identified in the abdominal or pelvic vasculature. No lymphadenopathy noted  in the abdomen or pelvis.  Reproductive: Prostate gland and seminal vesicles are normal in appearance.  Other: No significant volume of ascites.  No pneumoperitoneum.  Musculoskeletal: There are no aggressive appearing lytic or blastic lesions noted in the visualized portions of the skeleton.  IMPRESSION: 1. No acute findings in the chest, abdomen or pelvis to account for the patient's symptoms. 2. Splenomegaly again noted, similar to the prior examination. 3. Mild scarring in the left lower lobe.   Electronically Signed   By: DVinnie LangtonM.D.   On: 01/28/2015 10:26   Ct Abdomen Pelvis W Contrast  01/28/2015   CLINICAL DATA:  39year old male with fever, body aches, nausea and diarrhea.  EXAM: CT CHEST, ABDOMEN, AND PELVIS WITH CONTRAST  TECHNIQUE: Multidetector CT imaging of the chest, abdomen and pelvis was performed following the standard protocol during bolus administration of intravenous contrast.  CONTRAST:  1063mOMNIPAQUE IOHEXOL 300 MG/ML  SOLN  COMPARISON:  CT of the chest, CT of the chest 11/18/2014. CT of the abdomen and pelvis 11/17/2014.  FINDINGS: CT CHEST FINDINGS  Mediastinum/Lymph Nodes: Heart size is normal. There is no significant pericardial fluid, thickening or pericardial calcification. No pathologically enlarged mediastinal or hilar lymph nodes. Esophagus is unremarkable in appearance. No axillary lymphadenopathy.  Lungs/Pleura:  Linear opacities in the left lung base favored to represent areas of scarring. No acute consolidative airspace disease. No pleural effusions. No suspicious appearing pulmonary nodules or masses.  Musculoskeletal/Soft Tissues: There are no aggressive appearing lytic or blastic lesions noted in the visualized portions of the skeleton.  CT ABDOMEN AND PELVIS FINDINGS  Hepatobiliary: Diffuse low attenuation throughout the hepatic parenchyma, compatible with hepatic steatosis. No cystic or solid hepatic lesions. No intra or extrahepatic biliary ductal dilatation.  Gallbladder is normal in appearance.  Pancreas: No pancreatic mass. No pancreatic ductal dilatation. No pancreatic or peripancreatic fluid or inflammatory changes.  Spleen: Spleen remains enlarged measuring 15.5 x 4.9 x 12.2 cm (estimated splenic volume of 463 mL).  Adrenals/Urinary Tract: Bilateral adrenal glands and bilateral kidneys are normal in appearance. No hydroureteronephrosis or perinephric stranding to indicate urinary tract obstruction at this time. Urinary bladder is normal in appearance.  Stomach/Bowel: Normal appearance of the stomach. No pathologic dilatation of small bowel or colon. Normal appendix.  Vascular/Lymphatic: No significant atherosclerotic disease, aneurysm or dissection identified in the abdominal or pelvic vasculature. No lymphadenopathy noted in the abdomen or pelvis.  Reproductive: Prostate gland and seminal vesicles are normal in appearance.  Other: No significant volume of ascites.  No pneumoperitoneum.  Musculoskeletal: There are no aggressive appearing lytic or blastic lesions noted in the visualized portions of the skeleton.  IMPRESSION: 1. No acute findings in the chest, abdomen or pelvis to account for the patient's symptoms. 2. Splenomegaly again noted, similar to the prior examination. 3. Mild scarring in the left lower lobe.   Electronically Signed   By: Vinnie Langton M.D.   On: 01/28/2015 10:26    Other results: EKG: normal sinus rhythm, ST depression in inferolateral leads, new change from EKG yesterday (8/5). Decreased depression of subsequent ECGs  Assessment & Plan by Problem: Active Problems:   Fever   Sepsis   Myalgia   Right shoulder pain  Fever and myalgias: Three months ago the patient was sent home after similar episode with the tentative diagnosis of viral etiology. Given the similar presentation and presence of a young child, viral etiology is again high on the differential. Due to joint involvement, septic arthritis is also on the differential  as a can't miss diagnosis. Other causes include rheumatologic etiologies, including polymyositis and SLE, especially since family history is unknown.  - CBC w/ differential unremarkable, will follow daily - Blood smear - Blood cultures x2 sent, f/u cultures from previous ED visit yesterday - Started on vanc and ceftazidime, will d/c due to likely viral etiology and no known source of infection - IVF for rehydration - Autoimmune work up (ANCA, ANA, CCP, Rheumatoid factor, ESR) - CT chest and abdomen only mild splenomegaly, otherwise unremarkable - Droplet precaution  FEN/GI: - Diet: Regular - Replace electrolytes as needed (K<4, Mg<2) - No zofran for nausea due to prolonged QT on ECG  Prophylaxis: - Lovenox 34m daily  Code: Full  Dispo: Home   This is a MCareers information officerNote.  The care of the patient was discussed with Dr. VZada Findersand the assessment and plan was formulated with their assistance.  Please see their note for official documentation of the patient encounter.   Signed: KSandria Manly Med Student 01/28/2015, 1:50 PM

## 2015-01-28 NOTE — Progress Notes (Signed)
Utilization Review completed.  

## 2015-01-28 NOTE — Discharge Instructions (Signed)
Continue to take Tylenol every 4 hours and ibuprofen every 6 hours regularly for fever bodyaches and chills. Stay well-hydrated. Return to the ER for neck stiffness, confusion, worsening symptoms after 48 hours or other concerns.  If you were given medicines take as directed.  If you are on coumadin or contraceptives realize their levels and effectiveness is altered by many different medicines.  If you have any reaction (rash, tongues swelling, other) to the medicines stop taking and see a physician.    If your blood pressure was elevated in the ER make sure you follow up for management with a primary doctor or return for chest pain, shortness of breath or stroke symptoms.  Please follow up as directed and return to the ER or see a physician for new or worsening symptoms.  Thank you. Filed Vitals:   01/27/15 2326 01/27/15 2330 01/28/15 0000 01/28/15 0030  BP:  107/66 119/74 105/68  Pulse:  81 94 92  Temp: 101.6 F (38.7 C)     TempSrc: Rectal     Resp:  SpO2:  95% 100% 96%

## 2015-01-28 NOTE — ED Notes (Signed)
Pt back from CT scans. Complaining of dull achy central chest pain. HR @ 125 bpm. EKG performed, MD Campos notified.

## 2015-01-28 NOTE — Care Management Note (Signed)
Case Management Note  Patient Details  Name: Hector Williamson MRN: [id number] Date of Birth: 1975/10/27  Subjective/Objective:                 PTA  adl's independent. Admitted with recurrent fevers and tachycardia. Pt with no PCP.   Action/Plan: CM to offer [facility] regarding no PCP. Return to home when medically stable. CM to f/u with disposition needs. Expected Discharge Date:                  Expected Discharge Plan:  Home/Self Care  In-House Referral:     Discharge planning Services  CM Consult  Post Acute Care Choice:    Choice offered to:     DME Arranged:    DME Agency:     HH Arranged:    HH Agency:     Status of Service:  In process, will continue to follow  Medicare Important Message Given:    Date Medicare IM Given:    Medicare IM give by:    Date Additional Medicare IM Given:    Additional Medicare Important Message give by:     If discussed at Long Length of Stay Meetings, dates discussed:    Additional Comments: Marinda Elk (Spouse) [phone], Buke Ramsay (Friend)  [phone]  Epifanio Lesches, RN 01/28/2015, 3:39 PM

## 2015-01-28 NOTE — ED Provider Notes (Addendum)
CSN: [id number]     Arrival date & time 01/28/15  0844 History   First MD Initiated Contact with Patient 01/28/15 [phone]     Chief Complaint  Patient presents with  . Fever  . Emesis  . Shortness of Breath     HPI Patient presents the emergency department with ongoing fever over the past 24 hours.  He had diarrhea several days ago.  His diarrhea has now resolved.  He reports mild nausea now.  Denies vomiting.  Reports diffuse myalgias and arthralgias.  Denies new rash.  He reports some ongoing cough and shortness of breath.  No chest pain.  States diffuse generalized abdominal pain that seems to be worse on the left as compared to the right.  Denies dysuria and urinary frequency.  No melena or hematochezia.  No recent sick contacts.  No recent travel outside the country.  Patient was seen in the emergency department last night for similar symptoms and his workup was without acute pathology and he was sent home with strict return precautions after management with IV fluids.  Patient returned because he was feeling worse and now more short of breath.   Past Medical History  Diagnosis Date  . Back pain     spasms   History reviewed. No pertinent past surgical history. History reviewed. No pertinent family history. History  Substance Use Topics  . Smoking status: Former Smoker    Types: Cigarettes    Quit date: 11/16/1999  . Smokeless tobacco: Former Neurosurgeon    Quit date: 11/15/1997  . Alcohol Use: Yes     Comment: occassionally    Review of Systems    Allergies  Penicillins  Home Medications   Prior to Admission medications   Medication Sig Start Date End Date Taking? Authorizing Provider  Ginkgo Biloba 40 MG TABS Take 1 tablet by mouth daily.   Yes Historical Provider, MD  HYDROmorphone (DILAUDID) 2 MG tablet Take 1 tablet (2 mg total) by mouth every 4 (four) hours as needed for severe pain. 11/19/14  Yes Dorothea Ogle, MD  Omega-3 Fatty Acids (FISH OIL PO) Take 1 capsule by mouth  daily.   Yes Historical Provider, MD  lisdexamfetamine (VYVANSE) 40 MG capsule Take 40 mg by mouth every morning.    Historical Provider, MD   BP 90/47 mmHg  Pulse 107  Temp(Src) 101.1 F (38.4 C) (Rectal)  Resp 15  Ht 6' (1.829 m)  Wt 192 lb (87.091 kg)  BMI 26.03 kg/m2  SpO2 92% Physical Exam  Constitutional: He is oriented to person, place, and time. He appears well-developed and well-nourished.  HENT:  Head: Normocephalic and atraumatic.  Eyes: EOM are normal.  Neck: Normal range of motion.  Cardiovascular: Normal rate, regular rhythm, normal heart sounds and intact distal pulses.   Pulmonary/Chest: Effort normal and breath sounds normal. No respiratory distress.  Abdominal: Soft. He exhibits no distension. There is no tenderness.  Musculoskeletal: Normal range of motion.  Neurological: He is alert and oriented to person, place, and time.  Skin: Skin is warm and dry.  Psychiatric: He has a normal mood and affect. Judgment normal.  Nursing note and vitals reviewed.   ED Course  Procedures  CRITICAL CARE Performed by: Lyanne Co Total critical care time: 32 Critical care time was exclusive of separately billable procedures and treating other patients. Critical care was necessary to treat or prevent imminent or life-threatening deterioration. Critical care was time spent personally by me on the following activities: development  of treatment plan with patient and/or surrogate as well as nursing, discussions with consultants, evaluation of patient's response to treatment, examination of patient, obtaining history from patient or surrogate, ordering and performing treatments and interventions, ordering and review of laboratory studies, ordering and review of radiographic studies, pulse oximetry and re-evaluation of patient's condition.   Labs Review Labs Reviewed  CBC WITH DIFFERENTIAL/PLATELET - Abnormal; Notable for the following:    HCT 37.6 (*)    Neutrophils Relative  % 79 (*)    Lymphocytes Relative 11 (*)    Lymphs Abs 0.6 (*)    All other components within normal limits  COMPREHENSIVE METABOLIC PANEL - Abnormal; Notable for the following:    Potassium 3.4 (*)    Glucose, Bld 106 (*)    Calcium 8.7 (*)    Total Protein 6.2 (*)    Total Bilirubin 1.3 (*)    All other components within normal limits  CULTURE, BLOOD (ROUTINE X 2)  CULTURE, BLOOD (ROUTINE X 2)  TROPONIN I  MONONUCLEOSIS SCREEN  I-STAT CG4 LACTIC ACID, ED    Imaging Review Dg Chest 2 View  01/28/2015   CLINICAL DATA:  Fever and right-sided chest pain  EXAM: CHEST  2 VIEW  COMPARISON:  11/16/2014  FINDINGS: There is minimal linear scarring or atelectasis in the lateral left base. The lungs are otherwise clear. There is no pleural effusion. Pulmonary vasculature is normal. Hilar and mediastinal contours are unremarkable and unchanged.  IMPRESSION: Minimal linear scarring or atelectasis in the lateral left base.   Electronically Signed   By: Ellery Plunk M.D.   On: 01/28/2015 00:57   Ct Chest W Contrast  01/28/2015   CLINICAL DATA:  39 year old male with fever, body aches, nausea and diarrhea.  EXAM: CT CHEST, ABDOMEN, AND PELVIS WITH CONTRAST  TECHNIQUE: Multidetector CT imaging of the chest, abdomen and pelvis was performed following the standard protocol during bolus administration of intravenous contrast.  CONTRAST:  OMNIPAQUE IOHEXOL 300 MG/ML  SOLN  COMPARISON:  CT of the chest, CT of the chest 11/18/2014. CT of the abdomen and pelvis 11/17/2014.  FINDINGS: CT CHEST FINDINGS  Mediastinum/Lymph Nodes: Heart size is normal. There is no significant pericardial fluid, thickening or pericardial calcification. No pathologically enlarged mediastinal or hilar lymph nodes. Esophagus is unremarkable in appearance. No axillary lymphadenopathy.  Lungs/Pleura: Linear opacities in the left lung base favored to represent areas of scarring. No acute consolidative airspace disease. No pleural  effusions. No suspicious appearing pulmonary nodules or masses.  Musculoskeletal/Soft Tissues: There are no aggressive appearing lytic or blastic lesions noted in the visualized portions of the skeleton.  CT ABDOMEN AND PELVIS FINDINGS  Hepatobiliary: Diffuse low attenuation throughout the hepatic parenchyma, compatible with hepatic steatosis. No cystic or solid hepatic lesions. No intra or extrahepatic biliary ductal dilatation. Gallbladder is normal in appearance.  Pancreas: No pancreatic mass. No pancreatic ductal dilatation. No pancreatic or peripancreatic fluid or inflammatory changes.  Spleen: Spleen remains enlarged measuring 15.5 x 4.9 x 12.2 cm (estimated splenic volume of 463 mL).  Adrenals/Urinary Tract: Bilateral adrenal glands and bilateral kidneys are normal in appearance. No hydroureteronephrosis or perinephric stranding to indicate urinary tract obstruction at this time. Urinary bladder is normal in appearance.  Stomach/Bowel: Normal appearance of the stomach. No pathologic dilatation of small bowel or colon. Normal appendix.  Vascular/Lymphatic: No significant atherosclerotic disease, aneurysm or dissection identified in the abdominal or pelvic vasculature. No lymphadenopathy noted in the abdomen or pelvis.  Reproductive: Prostate gland  and seminal vesicles are normal in appearance.  Other: No significant volume of ascites.  No pneumoperitoneum.  Musculoskeletal: There are no aggressive appearing lytic or blastic lesions noted in the visualized portions of the skeleton.  IMPRESSION: 1. No acute findings in the chest, abdomen or pelvis to account for the patient's symptoms. 2. Splenomegaly again noted, similar to the prior examination. 3. Mild scarring in the left lower lobe.   Electronically Signed   By: Trudie Reed M.D.   On: 01/28/2015 10:26   Ct Abdomen Pelvis W Contrast  01/28/2015   CLINICAL DATA:  39 year old male with fever, body aches, nausea and diarrhea.  EXAM: CT CHEST, ABDOMEN,  AND PELVIS WITH CONTRAST  TECHNIQUE: Multidetector CT imaging of the chest, abdomen and pelvis was performed following the standard protocol during bolus administration of intravenous contrast.  CONTRAST:  OMNIPAQUE IOHEXOL 300 MG/ML  SOLN  COMPARISON:  CT of the chest, CT of the chest 11/18/2014. CT of the abdomen and pelvis 11/17/2014.  FINDINGS: CT CHEST FINDINGS  Mediastinum/Lymph Nodes: Heart size is normal. There is no significant pericardial fluid, thickening or pericardial calcification. No pathologically enlarged mediastinal or hilar lymph nodes. Esophagus is unremarkable in appearance. No axillary lymphadenopathy.  Lungs/Pleura: Linear opacities in the left lung base favored to represent areas of scarring. No acute consolidative airspace disease. No pleural effusions. No suspicious appearing pulmonary nodules or masses.  Musculoskeletal/Soft Tissues: There are no aggressive appearing lytic or blastic lesions noted in the visualized portions of the skeleton.  CT ABDOMEN AND PELVIS FINDINGS  Hepatobiliary: Diffuse low attenuation throughout the hepatic parenchyma, compatible with hepatic steatosis. No cystic or solid hepatic lesions. No intra or extrahepatic biliary ductal dilatation. Gallbladder is normal in appearance.  Pancreas: No pancreatic mass. No pancreatic ductal dilatation. No pancreatic or peripancreatic fluid or inflammatory changes.  Spleen: Spleen remains enlarged measuring 15.5 x 4.9 x 12.2 cm (estimated splenic volume of 463 mL).  Adrenals/Urinary Tract: Bilateral adrenal glands and bilateral kidneys are normal in appearance. No hydroureteronephrosis or perinephric stranding to indicate urinary tract obstruction at this time. Urinary bladder is normal in appearance.  Stomach/Bowel: Normal appearance of the stomach. No pathologic dilatation of small bowel or colon. Normal appendix.  Vascular/Lymphatic: No significant atherosclerotic disease, aneurysm or dissection identified in the  abdominal or pelvic vasculature. No lymphadenopathy noted in the abdomen or pelvis.  Reproductive: Prostate gland and seminal vesicles are normal in appearance.  Other: No significant volume of ascites.  No pneumoperitoneum.  Musculoskeletal: There are no aggressive appearing lytic or blastic lesions noted in the visualized portions of the skeleton.  IMPRESSION: 1. No acute findings in the chest, abdomen or pelvis to account for the patient's symptoms. 2. Splenomegaly again noted, similar to the prior examination. 3. Mild scarring in the left lower lobe.   Electronically Signed   By: Trudie Reed M.D.   On: 01/28/2015 10:26  I personally reviewed the imaging tests through PACS system I reviewed available ER/hospitalization records through the EMR   EKG Interpretation #1 Date/Time:  Saturday January 28 2015 10:18:20 EDT Ventricular Rate:  122 PR Interval:  115 QRS Duration: 93 QT Interval:  451 QTC Calculation: 643 R Axis:   92 Text Interpretation:  Sinus tachycardia Borderline right axis deviation  Repol abnrm suggests ischemia, diffuse leads Prolonged QT interval changed  from prior ecg, now with inferior and anterolateral T wave inversions  Confirmed by Emily Massar  MD, Lakin Romer ([id number]) on 01/28/2015 10:26:08 AM  ECG interpretation #2  Date: 01/28/2015  Rate: 115  Rhythm: normal sinus rhythm  QRS Axis: normal  Intervals: normal  ST/T Wave abnormalities: normal  Conduction Disutrbances: none  Narrative Interpretation:   Old EKG Reviewed: mild improvement in anterolateral T wave inversions   MDM   Final diagnoses:  Fever, unspecified fever cause  Sepsis, due to unspecified organism  Hypotension, unspecified hypotension type    Patient with ongoing recurrent fever.  Tachycardic.  After 2 L of fluid the patient became hypotensive with a blood pressure of 90/47.  Third liter of fluid given at this time.  Broad-spectrum antibiotics for sepsis unknown etiology.  CT imaging of the  chest abdomen pelvis is without acute pathology except for mild ongoing splenomegaly.  Testing for mononucleosis will be added.  Will admit for ongoing workup and supportive care.  Admission to stepdown unit.  No indication for lumbar puncture this time.  No meningeal signs.  Patient with transient chest pressure that now has resolved.  Troponin is normal.  My suspicion for ACS is low however he did have what appears to be anterolateral T-wave inversions associated with this.  This may be coronary spasm or supply demand issue from sepsis.  Doubt obstructing coronary disease.  Chest pain-free at this time.  Azalia Bilis, MD 01/28/15 [id number]  Azalia Bilis, MD 02/16/15 [phone]

## 2015-01-28 NOTE — ED Notes (Signed)
Pt transporting to xray.  

## 2015-01-28 NOTE — ED Notes (Signed)
Internal med at bedside.

## 2015-01-28 NOTE — ED Notes (Signed)
Pt leaving department for scans. 

## 2015-01-28 NOTE — H&P (Signed)
Date: 01/28/2015               Patient Name:  Hector Williamson MRN: [id number]  DOB: 1975/11/19 Age / Sex: 39 y.o., male   PCP: No primary care provider on file.         Medical Service: Internal Medicine Teaching Service         Attending Physician: Dr. Bartholomew Crews, MD    First Contact: Sandria Manly, MS3 Pager: [phone]  Second Contact: Dr. Zada Finders Pager: [phone]       After Hours (After 5p/  First Contact Pager: [phone]  weekends / holidays): Second Contact Pager: [phone]   Chief Complaint: Fever, Generalized muscle aches  History of Present Illness: Hector Williamson is a 39 year old gentleman with PMH of back pain and ADHD who presents to the ED with complaints of fever and generalized muscle and joint pain. He presented to the ED yesterday with similar symptoms and workup showed no acute pathology. He was sent home after management of IV fluids with advise to use tylenol for fever and pain. He returned today with continued symptoms, now feeling worse and with shortness of breath. He states that these symptoms started about 24 hours ago. His muscle pain is described as a dull achy pain and his joint pain is stated to be in his right should, hip, and ankle. No left-sided joint pain. He reports having non-bloody diarrhea several days ago which is now resolved. He has tried Ibuprofen and Tylenol without relief.  He has additional complaints over the last day of night sweats, chills, lightheadedness, right-sided tooth pain, and headache. Denies recent travel, no sick contacts, sexually active only with wife. He has two dogs, fully vaccinated, denies any bites. Denies any insect/tick bites. Patient was on Vyvanse for ADHD, which he stopped on Monday, 01/23/2015. He initially thought this was withdrawal, but does not believe that is the case anymore. No other recent changes in medication. Denies any recent changes in diet. Former smoker quit 8 years ago, occasional alcohol, denies  illicit drug use. Patient adopted, family history unknown.  Patient did have episode of retrosternal dull chest ache in ED which resolved after five minutes.  Patient was admitted to hospital for similar but more severe episode in May 2016. He spent four days in hospital, had complete workup for suspected sepsis/meningitis without definitive diagnosis. Was on vancomycin in hospital discharged on empiric doxycycline. He states he was at [facility] after discharge. He denies any other similar episodes.  Vitals on admission: BP 90/47, Pulse 107, Temp 101.1 F (rectal), RR 15, SpO2 92%  Meds: Current Facility-Administered Medications  Medication Dose Route Frequency Provider Last Rate Last Dose  . 0.9 %  sodium chloride infusion   Intravenous STAT Jola Schmidt, MD 125 mL/hr at 01/28/15 1407 125 mL/hr at 01/28/15 1407  . acetaminophen (TYLENOL) tablet 650 mg  650 mg Oral Q6H PRN Lucious Groves, DO       Or  . acetaminophen (TYLENOL) suppository 650 mg  650 mg Rectal Q6H PRN Lucious Groves, DO      . enoxaparin (LOVENOX) injection 40 mg  40 mg Subcutaneous Q24H Lucious Groves, DO      . iohexol (OMNIPAQUE) 300 MG/ML solution 25 mL  25 mL Oral Once PRN Medication Radiologist, MD      . sodium chloride 0.9 % injection 3 mL  3 mL Intravenous Q12H Lucious Groves, DO   3 mL at  01/28/15 1410    Allergies: Allergies as of 01/28/2015 - Review Complete 01/28/2015  Allergen Reaction Noted  . Penicillins Other (See Comments) 06/08/2011   Past Medical History  Diagnosis Date  . Back pain     spasms  . ADHD (attention deficit hyperactivity disorder)    History reviewed. No pertinent past surgical history. History reviewed. No pertinent family history. History   Social History  . Marital Status: Single    Spouse Name: N/A  . Number of Children: N/A  . Years of Education: N/A   Occupational History  . Not on file.   Social History Main Topics  . Smoking status: Former Smoker    Types:  Cigarettes    Quit date: 11/16/1999  . Smokeless tobacco: Former Systems developer    Quit date: 11/15/1997  . Alcohol Use: Yes     Comment: occassionally  . Drug Use: No     Comment: former user: marijuana, cocaine; last used 2007  . Sexual Activity: Not on file   Other Topics Concern  . Not on file   Social History Narrative   Adopted does not know birth parents.  Served 4 years in the [employer], predominately in [place] but did 2 tours in [place].  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  Married, 1 child, moved to [place] from [place] in 2003.    Review of Systems: Review of Systems  Constitutional: Positive for fever, chills, malaise/fatigue and diaphoresis.  HENT: Negative for congestion, ear pain, hearing loss and sore throat.   Eyes: Negative for blurred vision.  Respiratory: Positive for cough and shortness of breath. Negative for hemoptysis, sputum production and wheezing.   Cardiovascular: Negative for palpitations, claudication and leg swelling.       Transient retrosternal dull chest pain.  Gastrointestinal: Positive for nausea. Negative for heartburn, vomiting, diarrhea, constipation, blood in stool and melena.  Genitourinary: Negative for dysuria, urgency, frequency and hematuria.  Musculoskeletal: Positive for myalgias and joint pain. Negative for falls.  Skin: Negative for itching and rash.  Neurological: Positive for dizziness, tingling, weakness and headaches. Negative for sensory change and speech change.     Physical Exam: Blood pressure 105/58, pulse 96, temperature 98.6 F (37 C), temperature source Oral, resp. rate 17, height 6' (1.829 m), weight 87.091 kg (192 lb), SpO2 100 %. Physical Exam  Constitutional: He is oriented to person, place, and time. He appears well-developed and well-nourished.  Lying in bed, seems to be in some distress.  HENT:  Head: Normocephalic and atraumatic.  Mouth/Throat: Oropharynx is clear and moist. No oropharyngeal exudate.  No  obvious sign of abscess or infection. Some halitosis noted. Tender to palpation on right side of face over right upper molar area.  Eyes: Conjunctivae and EOM are normal. Pupils are equal, round, and reactive to light. No scleral icterus.  Cardiovascular: Regular rhythm and normal heart sounds.  Tachycardia present.   Pulmonary/Chest: Effort normal and breath sounds normal. No respiratory distress. He has no wheezes.  Abdominal: Soft. Bowel sounds are normal. There is no tenderness.  Musculoskeletal: He exhibits no edema.  Patient had pain in his right shoulder when asked to sit up, obvious wincing and distress. ROM decreased. There is warmth compared to left. No fluid, bruising, or tenderness to palpation appreciated.  Lymphadenopathy:    He has no cervical adenopathy.  Neurological: He is alert and oriented to person, place, and time.  Skin: Skin is warm.     Lab results:  Basic Metabolic Panel:  Recent Labs  01/27/15 2250 01/28/15 0906  NA 140 137  K 3.3* 3.4*  CL 103 105  CO2 25 22  GLUCOSE 106* 106*  BUN 11 9  CREATININE 1.00 0.97  CALCIUM 9.3 8.7*   Liver Function Tests:  Recent Labs  01/27/15 2250 01/28/15 0906  AST 48* 41  ALT 55 48  ALKPHOS 80 60  BILITOT 0.9 1.3*  PROT 7.9 6.2*  ALBUMIN 4.4 3.7   No results for input(s): LIPASE, AMYLASE in the last 72 hours. No results for input(s): AMMONIA in the last 72 hours. CBC:  Recent Labs  01/27/15 2250 01/28/15 0906  WBC 5.6 5.4  NEUTROABS 4.5 4.3  HGB 15.2 13.4  HCT 42.0 37.6*  MCV 86.1 85.5  PLT 183 160   Cardiac Enzymes:  Recent Labs  01/27/15 2250 01/28/15 0906  CKTOTAL 82  --   TROPONINI  --  <0.03   BNP: No results for input(s): PROBNP in the last 72 hours. D-Dimer: No results for input(s): DDIMER in the last 72 hours. CBG: No results for input(s): GLUCAP in the last 72 hours. Hemoglobin A1C: No results for input(s): HGBA1C in the last 72 hours. Fasting Lipid Panel: No results for  input(s): CHOL, HDL, LDLCALC, TRIG, CHOLHDL, LDLDIRECT in the last 72 hours. Thyroid Function Tests: No results for input(s): TSH, T4TOTAL, FREET4, T3FREE, THYROIDAB in the last 72 hours. Anemia Panel: No results for input(s): VITAMINB12, FOLATE, FERRITIN, TIBC, IRON, RETICCTPCT in the last 72 hours. Coagulation: No results for input(s): LABPROT, INR in the last 72 hours. Urine Drug Screen: Drugs of Abuse     Component Value Date/Time   LABOPIA NONE DETECTED 11/17/2014 0628   COCAINSCRNUR NONE DETECTED 11/17/2014 0628   LABBENZ NONE DETECTED 11/17/2014 0628   AMPHETMU POSITIVE* 11/17/2014 0628   THCU NONE DETECTED 11/17/2014 0628   LABBARB NONE DETECTED 11/17/2014 0628    Alcohol Level: No results for input(s): ETH in the last 72 hours. Urinalysis:  Recent Labs  01/28/15 0055  COLORURINE YELLOW  LABSPEC 1.017  PHURINE 6.5  GLUCOSEU NEGATIVE  HGBUR NEGATIVE  BILIRUBINUR NEGATIVE  KETONESUR NEGATIVE  PROTEINUR NEGATIVE  UROBILINOGEN 0.2  NITRITE NEGATIVE  LEUKOCYTESUR NEGATIVE   Misc. Labs:  Imaging results:  Dg Orthopantogram  01/28/2015   CLINICAL DATA:  Chronic right upper jaw pain.  EXAM: ORTHOPANTOGRAM/PANORAMIC  COMPARISON:  None.  FINDINGS: Teeth 1, 17 and 32 have been extracted. Retained tooth 16 appears impacted or barely erupted. There may be a very subtle caries involving tooth 2. No periapical lucencies identified. Other teeth appear unremarkable with evidence of prior dental hardware and several root canals. Or mandible and maxilla are unremarkable. No bony or dental mass lesions or cysts identified.  IMPRESSION: Remaining wisdom molar, tooth 16, appears impacted or barely erupted. There may be subtle dental caries involving tooth 2.   Electronically Signed   By: Aletta Edouard M.D.   On: 01/28/2015 13:23   Dg Chest 2 View  01/28/2015   CLINICAL DATA:  Fever and right-sided chest pain  EXAM: CHEST  2 VIEW  COMPARISON:  11/16/2014  FINDINGS: There is minimal  linear scarring or atelectasis in the lateral left base. The lungs are otherwise clear. There is no pleural effusion. Pulmonary vasculature is normal. Hilar and mediastinal contours are unremarkable and unchanged.  IMPRESSION: Minimal linear scarring or atelectasis in the lateral left base.   Electronically Signed   By: Andreas Newport M.D.   On: 01/28/2015  00:57   Dg Shoulder Right  01/28/2015   CLINICAL DATA:  39 year old male with history of right-sided shoulder pain yesterday evening, with some associated chills, nausea and fever.  EXAM: RIGHT SHOULDER - 2+ VIEW  COMPARISON:  No priors.  FINDINGS: There is no evidence of fracture or dislocation. There is no evidence of arthropathy or other focal bone abnormality. Soft tissues are unremarkable.  IMPRESSION: Negative.   Electronically Signed   By: Vinnie Langton M.D.   On: 01/28/2015 13:17   Ct Chest W Contrast  01/28/2015   CLINICAL DATA:  39 year old male with fever, body aches, nausea and diarrhea.  EXAM: CT CHEST, ABDOMEN, AND PELVIS WITH CONTRAST  TECHNIQUE: Multidetector CT imaging of the chest, abdomen and pelvis was performed following the standard protocol during bolus administration of intravenous contrast.  CONTRAST:  165m OMNIPAQUE IOHEXOL 300 MG/ML  SOLN  COMPARISON:  CT of the chest, CT of the chest 11/18/2014. CT of the abdomen and pelvis 11/17/2014.  FINDINGS: CT CHEST FINDINGS  Mediastinum/Lymph Nodes: Heart size is normal. There is no significant pericardial fluid, thickening or pericardial calcification. No pathologically enlarged mediastinal or hilar lymph nodes. Esophagus is unremarkable in appearance. No axillary lymphadenopathy.  Lungs/Pleura: Linear opacities in the left lung base favored to represent areas of scarring. No acute consolidative airspace disease. No pleural effusions. No suspicious appearing pulmonary nodules or masses.  Musculoskeletal/Soft Tissues: There are no aggressive appearing lytic or blastic lesions noted in  the visualized portions of the skeleton.  CT ABDOMEN AND PELVIS FINDINGS  Hepatobiliary: Diffuse low attenuation throughout the hepatic parenchyma, compatible with hepatic steatosis. No cystic or solid hepatic lesions. No intra or extrahepatic biliary ductal dilatation. Gallbladder is normal in appearance.  Pancreas: No pancreatic mass. No pancreatic ductal dilatation. No pancreatic or peripancreatic fluid or inflammatory changes.  Spleen: Spleen remains enlarged measuring 15.5 x 4.9 x 12.2 cm (estimated splenic volume of 463 mL).  Adrenals/Urinary Tract: Bilateral adrenal glands and bilateral kidneys are normal in appearance. No hydroureteronephrosis or perinephric stranding to indicate urinary tract obstruction at this time. Urinary bladder is normal in appearance.  Stomach/Bowel: Normal appearance of the stomach. No pathologic dilatation of small bowel or colon. Normal appendix.  Vascular/Lymphatic: No significant atherosclerotic disease, aneurysm or dissection identified in the abdominal or pelvic vasculature. No lymphadenopathy noted in the abdomen or pelvis.  Reproductive: Prostate gland and seminal vesicles are normal in appearance.  Other: No significant volume of ascites.  No pneumoperitoneum.  Musculoskeletal: There are no aggressive appearing lytic or blastic lesions noted in the visualized portions of the skeleton.  IMPRESSION: 1. No acute findings in the chest, abdomen or pelvis to account for the patient's symptoms. 2. Splenomegaly again noted, similar to the prior examination. 3. Mild scarring in the left lower lobe.   Electronically Signed   By: DVinnie LangtonM.D.   On: 01/28/2015 10:26   Ct Abdomen Pelvis W Contrast  01/28/2015   CLINICAL DATA:  39year old male with fever, body aches, nausea and diarrhea.  EXAM: CT CHEST, ABDOMEN, AND PELVIS WITH CONTRAST  TECHNIQUE: Multidetector CT imaging of the chest, abdomen and pelvis was performed following the standard protocol during bolus  administration of intravenous contrast.  CONTRAST:  1032mOMNIPAQUE IOHEXOL 300 MG/ML  SOLN  COMPARISON:  CT of the chest, CT of the chest 11/18/2014. CT of the abdomen and pelvis 11/17/2014.  FINDINGS: CT CHEST FINDINGS  Mediastinum/Lymph Nodes: Heart size is normal. There is no significant pericardial fluid, thickening or pericardial  calcification. No pathologically enlarged mediastinal or hilar lymph nodes. Esophagus is unremarkable in appearance. No axillary lymphadenopathy.  Lungs/Pleura: Linear opacities in the left lung base favored to represent areas of scarring. No acute consolidative airspace disease. No pleural effusions. No suspicious appearing pulmonary nodules or masses.  Musculoskeletal/Soft Tissues: There are no aggressive appearing lytic or blastic lesions noted in the visualized portions of the skeleton.  CT ABDOMEN AND PELVIS FINDINGS  Hepatobiliary: Diffuse low attenuation throughout the hepatic parenchyma, compatible with hepatic steatosis. No cystic or solid hepatic lesions. No intra or extrahepatic biliary ductal dilatation. Gallbladder is normal in appearance.  Pancreas: No pancreatic mass. No pancreatic ductal dilatation. No pancreatic or peripancreatic fluid or inflammatory changes.  Spleen: Spleen remains enlarged measuring 15.5 x 4.9 x 12.2 cm (estimated splenic volume of 463 mL).  Adrenals/Urinary Tract: Bilateral adrenal glands and bilateral kidneys are normal in appearance. No hydroureteronephrosis or perinephric stranding to indicate urinary tract obstruction at this time. Urinary bladder is normal in appearance.  Stomach/Bowel: Normal appearance of the stomach. No pathologic dilatation of small bowel or colon. Normal appendix.  Vascular/Lymphatic: No significant atherosclerotic disease, aneurysm or dissection identified in the abdominal or pelvic vasculature. No lymphadenopathy noted in the abdomen or pelvis.  Reproductive: Prostate gland and seminal vesicles are normal in  appearance.  Other: No significant volume of ascites.  No pneumoperitoneum.  Musculoskeletal: There are no aggressive appearing lytic or blastic lesions noted in the visualized portions of the skeleton.  IMPRESSION: 1. No acute findings in the chest, abdomen or pelvis to account for the patient's symptoms. 2. Splenomegaly again noted, similar to the prior examination. 3. Mild scarring in the left lower lobe.   Electronically Signed   By: Vinnie Langton M.D.   On: 01/28/2015 10:26    Other results: EKG: New inferior and anterolateral t-wave inversions/ST depression from prior EKG yesterday, prolonged QT. Decreased ST depression on follow up EKG.  Assessment & Plan by Problem: Active Problems:   Fever   Sepsis   Myalgia   Right shoulder pain  Fever rule out Sepsis: Patient had similar more severe episode in May 2016 with no definitive diagnosis after extensive workup, finally referred to as fever of unknown origin. Patient states his symptoms are similar, but milder to last event. Imaging in ED mostly negative except for splenomegaly similar to prior exam as well as mild scarring in left lower lobe. Patient has right tooth pain, tender on palpation of outer face. Orthopantogram shows only subtle caries involving tooth 2. No obvious sign of bacterial infection, more likely to be inflammatory or viral cause. Will need to rule out septic process. -Blood Cultures x2 obtained in ED prior to abx -Vancomycin and Ceftazidime in ED, will discontinue -CBC w/Diff, save smear with pathologist review -BMP -MRSA pcr screening -Respiratory virus panel -Tylenol 650 mg q6h prn for pain/fever -IV NS 125 mL/hr -O2 2L Nasal cannula, keep sat >92% -droplet precaution  Myalgia: Patient has generalized muscle pains described as an achy sensation. States that it was bad enough that he was unable to walk this morning, but states that has improved. Non tender to palpation of muscles. Will do autoimmune workup for  causes such as polymyositis. -ESR -CRP -ANA -Ferritin -CCP -RF -ANCA   Right shoulder pain: Patient had obvious wincing and pain of right shoulder when asked to sit up from laying position during examination. ROM was decreased. There was fluidity or swelling of joint. Some warmth compared to left. Pain seemed to be  relieved on laying back down. -CT Chest reviewed with radiology for limited view of right shoulder, no obvious pathology noted on limited view -Right shoulder x-ray - negative  DVT prophylaxis - Lovenox 40 mg daily  Code: Full   Dispo: Disposition is deferred at this time, awaiting improvement of current medical problems. Anticipated discharge in approximately 1-3 day(s).   The patient does not have a current PCP (No primary care provider on file.) and does need an [facility] hospital follow-up appointment after discharge.  The patient does not have transportation limitations that hinder transportation to clinic appointments.  Signed: Zada Finders, MD 01/28/2015, 3:09 PM

## 2015-01-28 NOTE — ED Notes (Signed)
MD at bedside. 

## 2015-01-29 DIAGNOSIS — D72819 Decreased white blood cell count, unspecified: Secondary | ICD-10-CM

## 2015-01-29 DIAGNOSIS — M2550 Pain in unspecified joint: Secondary | ICD-10-CM | POA: Insufficient documentation

## 2015-01-29 DIAGNOSIS — M25559 Pain in unspecified hip: Secondary | ICD-10-CM

## 2015-01-29 DIAGNOSIS — M25571 Pain in right ankle and joints of right foot: Secondary | ICD-10-CM

## 2015-01-29 DIAGNOSIS — R778 Other specified abnormalities of plasma proteins: Secondary | ICD-10-CM | POA: Diagnosis present

## 2015-01-29 DIAGNOSIS — M25532 Pain in left wrist: Secondary | ICD-10-CM

## 2015-01-29 DIAGNOSIS — R51 Headache: Secondary | ICD-10-CM

## 2015-01-29 DIAGNOSIS — M25561 Pain in right knee: Secondary | ICD-10-CM

## 2015-01-29 DIAGNOSIS — R7989 Other specified abnormal findings of blood chemistry: Secondary | ICD-10-CM | POA: Diagnosis present

## 2015-01-29 LAB — CBC
HEMATOCRIT: 35.2 % — AB (ref 39.0–52.0)
Hemoglobin: 12.4 g/dL — ABNORMAL LOW (ref 13.0–17.0)
MCH: 31 pg (ref 26.0–34.0)
MCHC: 35.2 g/dL (ref 30.0–36.0)
MCV: 88 fL (ref 78.0–100.0)
Platelets: 132 10*3/uL — ABNORMAL LOW (ref 150–400)
RBC: 4 MIL/uL — AB (ref 4.22–5.81)
RDW: 13.6 % (ref 11.5–15.5)
WBC: 3.8 10*3/uL — ABNORMAL LOW (ref 4.0–10.5)

## 2015-01-29 LAB — BASIC METABOLIC PANEL
Anion gap: 7 (ref 5–15)
BUN: 5 mg/dL — ABNORMAL LOW (ref 6–20)
CALCIUM: 7.7 mg/dL — AB (ref 8.9–10.3)
CHLORIDE: 106 mmol/L (ref 101–111)
CO2: 25 mmol/L (ref 22–32)
Creatinine, Ser: 0.95 mg/dL (ref 0.61–1.24)
GFR calc Af Amer: >60 mL/min (ref >60)
GFR calc non Af Amer: >60 mL/min (ref >60)
Glucose, Bld: 107 mg/dL — ABNORMAL HIGH (ref 65–99)
POTASSIUM: 3.4 mmol/L — AB (ref 3.5–5.1)
Sodium: 138 mmol/L (ref 135–145)

## 2015-01-29 LAB — TROPONIN I
Troponin I: 0.13 ng/mL — ABNORMAL HIGH (ref <0.031)
Troponin I: 0.1 ng/mL — ABNORMAL HIGH (ref <0.031)

## 2015-01-29 LAB — SAVE SMEAR

## 2015-01-29 LAB — URINE CULTURE: Culture: NO GROWTH

## 2015-01-29 LAB — RHEUMATOID FACTOR: Rhuematoid fact SerPl-aCnc: <7 IU/mL (ref 0.0–13.9)

## 2015-01-29 MED ORDER — SODIUM CHLORIDE 0.9 % IV SOLN
INTRAVENOUS | Status: DC
  Administered 2015-01-29: 02:00:00 via INTRAVENOUS
  Administered 2015-01-30: 125 mL/h via INTRAVENOUS
  Administered 2015-01-30: 21:00:00 via INTRAVENOUS

## 2015-01-29 MED ORDER — OXYCODONE HCL 5 MG PO TABS
5.0000 mg | ORAL_TABLET | ORAL | Status: DC | PRN
  Administered 2015-01-29 – 2015-01-31 (×6): 10 mg via ORAL
  Filled 2015-01-29 (×6): qty 2

## 2015-01-29 MED ORDER — OXYCODONE HCL 5 MG PO TABS
5.0000 mg | ORAL_TABLET | ORAL | Status: DC | PRN
  Administered 2015-01-29: 5 mg via ORAL
  Filled 2015-01-29: qty 1

## 2015-01-29 MED ORDER — ASPIRIN EC 325 MG PO TBEC
325.0000 mg | DELAYED_RELEASE_TABLET | Freq: Every day | ORAL | Status: DC
  Administered 2015-01-29 – 2015-01-30 (×2): 325 mg via ORAL
  Filled 2015-01-29 (×2): qty 1

## 2015-01-29 MED ORDER — IBUPROFEN 400 MG PO TABS
400.0000 mg | ORAL_TABLET | Freq: Four times a day (QID) | ORAL | Status: DC | PRN
  Administered 2015-01-29 – 2015-01-31 (×4): 400 mg via ORAL
  Filled 2015-01-29: qty 2
  Filled 2015-01-29 (×2): qty 1
  Filled 2015-01-29: qty 2

## 2015-01-29 NOTE — Progress Notes (Signed)
Subjective: Patient states he is feeling worse this morning compared to yesterday. He continues to endorse muscle pain all over as well as increased pain of right knee and left wrist in addition to right shoulder and ankle. He states that tylenol has helped occasionally. He did spike a fever of 102 F overnight treated with tylenol. Has complaints of tingling in arms. Denies any noticeable lumps, bumps, or testicular swelling. His pain is not controlled well with Vicodin.  Objective: Vital signs in last 24 hours: Filed Vitals:   01/29/15 0228 01/29/15 0326 01/29/15 0600 01/29/15 0742  BP: 106/51   100/42  Pulse: 84   71  Temp: 102.9 F (39.4 C) 102.1 F (38.9 C) 99.7 F (37.6 C) 100.3 F (37.9 C)  TempSrc: Oral  Oral Oral  Resp: 19   16  Height:      Weight:      SpO2: 100%   97%   Weight change:   Intake/Output Summary (Last 24 hours) at 01/29/15 1011 Last data filed at 01/29/15 0800  Gross per 24 hour  Intake 5229.67 ml  Output   5600 ml  Net -370.33 ml   General: resting in bed, moderate distress HEENT: EOMI, no scleral icterus Cardiac: RRR, no rubs, murmurs or gallops Pulm: clear to auscultation bilaterally Abd: soft, tender to palpation RUQ & RLQ, nondistended, BS present Ext: warm and well perfused, no pedal edema, tender right knee GU: tender in right inguinal area, no masses or hernia felt, no scrotal swelling, no varicocele, non-tender testes, no orchitis Skin: no rashes or lesions Neuro: alert and oriented X3  Lab Results: Basic Metabolic Panel:  Recent Labs Lab 01/28/15 0906 01/29/15 0258  NA 137 138  K 3.4* 3.4*  CL 105 106  CO2 22 25  GLUCOSE 106* 107*  BUN 9 5*  CREATININE 0.97 0.95  CALCIUM 8.7* 7.7*   Liver Function Tests:  Recent Labs Lab 01/27/15 2250 01/28/15 0906  AST 48* 41  ALT 55 48  ALKPHOS 80 60  BILITOT 0.9 1.3*  PROT 7.9 6.2*  ALBUMIN 4.4 3.7   CBC:  Recent Labs Lab 01/27/15 2250 01/28/15 0906 01/29/15 0258  WBC  5.6 5.4 3.8*  NEUTROABS 4.5 4.3  --   HGB 15.2 13.4 12.4*  HCT 42.0 37.6* 35.2*  MCV 86.1 85.5 88.0  PLT 183 160 132*   Cardiac Enzymes:  Recent Labs Lab 01/27/15 2250 01/28/15 0906 01/29/15 0257  CKTOTAL 82  --   --   TROPONINI  --  <0.03 0.13*   Anemia Panel:  Recent Labs Lab 01/28/15 1520  FERRITIN 127   Urine Drug Screen: Drugs of Abuse     Component Value Date/Time   LABOPIA NONE DETECTED 11/17/2014 0628   COCAINSCRNUR NONE DETECTED 11/17/2014 0628   LABBENZ NONE DETECTED 11/17/2014 0628   AMPHETMU POSITIVE* 11/17/2014 0628   THCU NONE DETECTED 11/17/2014 0628   LABBARB NONE DETECTED 11/17/2014 0628    Urinalysis:  Recent Labs Lab 01/28/15 0055  COLORURINE YELLOW  LABSPEC 1.017  PHURINE 6.5  GLUCOSEU NEGATIVE  HGBUR NEGATIVE  BILIRUBINUR NEGATIVE  KETONESUR NEGATIVE  PROTEINUR NEGATIVE  UROBILINOGEN 0.2  NITRITE NEGATIVE  LEUKOCYTESUR NEGATIVE    Micro Results: Recent Results (from the past 240 hour(s))  Rapid strep screen     Status: None   Collection Time: 01/27/15 11:54 PM  Result Value Ref Range Status   Streptococcus, Group A Screen (Direct) NEGATIVE NEGATIVE Final    Comment: (NOTE) A Rapid Antigen  test may result negative if the antigen level in the sample is below the detection level of this test. The FDA has not cleared this test as a stand-alone test therefore the rapid antigen negative result has reflexed to a Group A Strep culture.   MRSA PCR Screening     Status: None   Collection Time: 01/28/15  1:52 PM  Result Value Ref Range Status   MRSA by PCR NEGATIVE NEGATIVE Final    Comment:        The GeneXpert MRSA Assay (FDA approved for NASAL specimens only), is one component of a comprehensive MRSA colonization surveillance program. It is not intended to diagnose MRSA infection nor to guide or monitor treatment for MRSA infections.    Studies/Results: Dg Orthopantogram  01/28/2015   CLINICAL DATA:  Chronic right upper  jaw pain.  EXAM: ORTHOPANTOGRAM/PANORAMIC  COMPARISON:  None.  FINDINGS: Teeth 1, 17 and 32 have been extracted. Retained tooth 16 appears impacted or barely erupted. There may be a very subtle caries involving tooth 2. No periapical lucencies identified. Other teeth appear unremarkable with evidence of prior dental hardware and several root canals. Or mandible and maxilla are unremarkable. No bony or dental mass lesions or cysts identified.  IMPRESSION: Remaining wisdom molar, tooth 16, appears impacted or barely erupted. There may be subtle dental caries involving tooth 2.   Electronically Signed   By: Aletta Edouard M.D.   On: 01/28/2015 13:23   Dg Chest 2 View  01/28/2015   CLINICAL DATA:  Fever and right-sided chest pain  EXAM: CHEST  2 VIEW  COMPARISON:  11/16/2014  FINDINGS: There is minimal linear scarring or atelectasis in the lateral left base. The lungs are otherwise clear. There is no pleural effusion. Pulmonary vasculature is normal. Hilar and mediastinal contours are unremarkable and unchanged.  IMPRESSION: Minimal linear scarring or atelectasis in the lateral left base.   Electronically Signed   By: Andreas Newport M.D.   On: 01/28/2015 00:57   Dg Shoulder Right  01/28/2015   CLINICAL DATA:  39 year old male with history of right-sided shoulder pain yesterday evening, with some associated chills, nausea and fever.  EXAM: RIGHT SHOULDER - 2+ VIEW  COMPARISON:  No priors.  FINDINGS: There is no evidence of fracture or dislocation. There is no evidence of arthropathy or other focal bone abnormality. Soft tissues are unremarkable.  IMPRESSION: Negative.   Electronically Signed   By: Vinnie Langton M.D.   On: 01/28/2015 13:17   Ct Chest W Contrast  01/28/2015   CLINICAL DATA:  39 year old male with fever, body aches, nausea and diarrhea.  EXAM: CT CHEST, ABDOMEN, AND PELVIS WITH CONTRAST  TECHNIQUE: Multidetector CT imaging of the chest, abdomen and pelvis was performed following the standard  protocol during bolus administration of intravenous contrast.  CONTRAST:  173m OMNIPAQUE IOHEXOL 300 MG/ML  SOLN  COMPARISON:  CT of the chest, CT of the chest 11/18/2014. CT of the abdomen and pelvis 11/17/2014.  FINDINGS: CT CHEST FINDINGS  Mediastinum/Lymph Nodes: Heart size is normal. There is no significant pericardial fluid, thickening or pericardial calcification. No pathologically enlarged mediastinal or hilar lymph nodes. Esophagus is unremarkable in appearance. No axillary lymphadenopathy.  Lungs/Pleura: Linear opacities in the left lung base favored to represent areas of scarring. No acute consolidative airspace disease. No pleural effusions. No suspicious appearing pulmonary nodules or masses.  Musculoskeletal/Soft Tissues: There are no aggressive appearing lytic or blastic lesions noted in the visualized portions of the skeleton.  CT  ABDOMEN AND PELVIS FINDINGS  Hepatobiliary: Diffuse low attenuation throughout the hepatic parenchyma, compatible with hepatic steatosis. No cystic or solid hepatic lesions. No intra or extrahepatic biliary ductal dilatation. Gallbladder is normal in appearance.  Pancreas: No pancreatic mass. No pancreatic ductal dilatation. No pancreatic or peripancreatic fluid or inflammatory changes.  Spleen: Spleen remains enlarged measuring 15.5 x 4.9 x 12.2 cm (estimated splenic volume of 463 mL).  Adrenals/Urinary Tract: Bilateral adrenal glands and bilateral kidneys are normal in appearance. No hydroureteronephrosis or perinephric stranding to indicate urinary tract obstruction at this time. Urinary bladder is normal in appearance.  Stomach/Bowel: Normal appearance of the stomach. No pathologic dilatation of small bowel or colon. Normal appendix.  Vascular/Lymphatic: No significant atherosclerotic disease, aneurysm or dissection identified in the abdominal or pelvic vasculature. No lymphadenopathy noted in the abdomen or pelvis.  Reproductive: Prostate gland and seminal vesicles  are normal in appearance.  Other: No significant volume of ascites.  No pneumoperitoneum.  Musculoskeletal: There are no aggressive appearing lytic or blastic lesions noted in the visualized portions of the skeleton.  IMPRESSION: 1. No acute findings in the chest, abdomen or pelvis to account for the patient's symptoms. 2. Splenomegaly again noted, similar to the prior examination. 3. Mild scarring in the left lower lobe.   Electronically Signed   By: Vinnie Langton M.D.   On: 01/28/2015 10:26   Ct Abdomen Pelvis W Contrast  01/28/2015   CLINICAL DATA:  39 year old male with fever, body aches, nausea and diarrhea.  EXAM: CT CHEST, ABDOMEN, AND PELVIS WITH CONTRAST  TECHNIQUE: Multidetector CT imaging of the chest, abdomen and pelvis was performed following the standard protocol during bolus administration of intravenous contrast.  CONTRAST:  120m OMNIPAQUE IOHEXOL 300 MG/ML  SOLN  COMPARISON:  CT of the chest, CT of the chest 11/18/2014. CT of the abdomen and pelvis 11/17/2014.  FINDINGS: CT CHEST FINDINGS  Mediastinum/Lymph Nodes: Heart size is normal. There is no significant pericardial fluid, thickening or pericardial calcification. No pathologically enlarged mediastinal or hilar lymph nodes. Esophagus is unremarkable in appearance. No axillary lymphadenopathy.  Lungs/Pleura: Linear opacities in the left lung base favored to represent areas of scarring. No acute consolidative airspace disease. No pleural effusions. No suspicious appearing pulmonary nodules or masses.  Musculoskeletal/Soft Tissues: There are no aggressive appearing lytic or blastic lesions noted in the visualized portions of the skeleton.  CT ABDOMEN AND PELVIS FINDINGS  Hepatobiliary: Diffuse low attenuation throughout the hepatic parenchyma, compatible with hepatic steatosis. No cystic or solid hepatic lesions. No intra or extrahepatic biliary ductal dilatation. Gallbladder is normal in appearance.  Pancreas: No pancreatic mass. No  pancreatic ductal dilatation. No pancreatic or peripancreatic fluid or inflammatory changes.  Spleen: Spleen remains enlarged measuring 15.5 x 4.9 x 12.2 cm (estimated splenic volume of 463 mL).  Adrenals/Urinary Tract: Bilateral adrenal glands and bilateral kidneys are normal in appearance. No hydroureteronephrosis or perinephric stranding to indicate urinary tract obstruction at this time. Urinary bladder is normal in appearance.  Stomach/Bowel: Normal appearance of the stomach. No pathologic dilatation of small bowel or colon. Normal appendix.  Vascular/Lymphatic: No significant atherosclerotic disease, aneurysm or dissection identified in the abdominal or pelvic vasculature. No lymphadenopathy noted in the abdomen or pelvis.  Reproductive: Prostate gland and seminal vesicles are normal in appearance.  Other: No significant volume of ascites.  No pneumoperitoneum.  Musculoskeletal: There are no aggressive appearing lytic or blastic lesions noted in the visualized portions of the skeleton.  IMPRESSION: 1. No acute findings in  the chest, abdomen or pelvis to account for the patient's symptoms. 2. Splenomegaly again noted, similar to the prior examination. 3. Mild scarring in the left lower lobe.   Electronically Signed   By: Vinnie Langton M.D.   On: 01/28/2015 10:26   Medications: I have reviewed the patient's current medications. Scheduled Meds: . aspirin EC  325 mg Oral Daily  . enoxaparin (LOVENOX) injection  40 mg Subcutaneous Q24H  . sodium chloride  3 mL Intravenous Q12H   Continuous Infusions: . sodium chloride 125 mL/hr at 01/29/15 0800   PRN Meds:.acetaminophen **OR** acetaminophen, oxyCODONE Assessment/Plan: Principal Problem:   Fever Active Problems:   Sepsis   Myalgia   Right shoulder pain   Pyrexia   Arterial hypotension   Pain, dental  Fever rule out Sepsis: Patient had similar more severe episode in May 2016 with no definitive diagnosis after extensive workup, finally  referred to as fever of unknown origin. Patient states his symptoms are worse this morning compared to yesterday. Imaging in ED mostly negative except for splenomegaly similar to prior exam as well as mild scarring in left lower lobe.  No obvious sign of bacterial infection, more likely to be inflammatory or viral cause. Will need to rule out septic process. Patient has relief from pain for only 30 mins with Vicodin. Patient had Trop 0.13 overnight, repeat EKG shows no changes from prior.   -Blood Cultures x2 obtained in ED prior to abx, awaiting results. -Discontinued Vancomycin and Ceftazidime which were given once each in ED -F/U on pathologist review of smear -F/U Respiratory virus panel -Tylenol 650 mg q6h prn for pain/fever -Oxycodone 5 mg q4h prn for pain, may consider increase to 10 mg if pain not controlled -IV NS 125 mL/hr -droplet precaution until repiratory viral panel results  Myalgia: Patient's generalized muscle pains are worse this morning. Non tender to palpation of muscles. ESR, CRP, Ferritin come back unremarkable. We are considering this to be either an inflammatory or viral cause, will continue supportive care and explore possible entities. -f/u ANA -f/u CCP -f/u RF -f/u ANCA  Right shoulder pain: Considered possible septic process, exam and imaging do not support this. -CT Chest reviewed with radiology for limited view of right shoulder, no obvious pathology noted on limited view -Right shoulder x-ray - negative  Pain, dental: Right upper molar pain considered to have abscess or cellulitis component, but no evidence from exam or imaging. -Orthopantogram shows only subtle caries involving tooth 2.   Dispo: Disposition is deferred at this time, awaiting improvement of current medical problems.  Anticipated discharge in approximately 2-3 day(s).   The patient does not have a current PCP (No primary care provider on file.) and does need an [facility] hospital follow-up  appointment after discharge.  The patient does not have transportation limitations that hinder transportation to clinic appointments.  .Services Needed at time of discharge: Y = Yes, Blank = No PT:   OT:   RN:   Equipment:   Other:     LOS: 1 day   Zada Finders, MD 01/29/2015, 10:11 AM

## 2015-01-29 NOTE — Progress Notes (Signed)
  Date: 01/29/2015  Patient name: Hector Williamson  Medical record number: [id number]  Date of birth: 1976/05/26   I have seen and evaluated Hector Williamson and discussed their care with the Residency Team. Hector Williamson is a 39 yo without sig PMHX. He was admitted in May 2016 for a febrile illness with final dx being a likely viral etiology. Blood cx, urine cx, CSF, CT abd / pelvis / chest, MRI, HIV viral load, RMSF, sed rate, CK, Mono screen all were unrevealing. Since then, he has been fine - without fevers - but did cont to have some back spasms. He returned with fever, joint pains, myalgias, and dyspnea. This AM, he reports a severe HA, K hip, R knee, R ankle, R shoulder, and L wrist pain. Denies CP.    Filed Vitals:   01/29/15 1510  BP:   Pulse:   Temp: 102.4 F (39.1 C)  Resp:   BP 90/47 - 108/66 HR 81 - 118 RR 13 - 27 O2 sat 97% Gen : appears ill and uncomfortable. Not moving much.  Neuro : mentating well. A&O H tachy, no MRG ABD + BS, mild tenderness Skin no rash Joints no synovitis, warmth, effusion, erythema No LE edema + tender LAD R inguinal   Assessment and Plan: I have seen and evaluated the patient as outlined above. I agree with the formulated Assessment and Plan as detailed in the residents' admission note, with the following changes:   1. Fever of unknown origin - Have to assume that the May admit and this admit are related since so similar. Not all results are back but some returning.  ** Infection - Doubt bacterial infxn. Doubt a bacterial infx would stay dormant for 3 months. No leukocytosis although mild leukopenia this AM. He is not on ABX and we are following repeat cx. A viral infxn is possible and a resp panel is pending.  ** Malignancy - he has had CT ABD/pelvis and chest along with MRI spine. Testicular exam nl. Blood smear pending.  ** Inflammatory ds - the almost nl SED rate makes this less likely but other tests pending. Nl ferritin makes Still's dx  unlikely and age and nl SED rate make Familial Mediterranean fever unlikely.   At this point, I do not know what is causing his sxs. We know what it prob isn't. Hopefully as the results cont to return, something might bc apparent or he might develop a new sxs that will point towards the dx.  Burns Spain, MD 8/7/20163:38 PM

## 2015-01-29 NOTE — Progress Notes (Signed)
   01/29/15 0228  Vitals  Temp (!) 102.9 F (39.4 C) (500 tylenol given)  paged MD about above temp 500 tylenol given no new orders, will continue to monitor

## 2015-01-29 NOTE — Progress Notes (Signed)
Subjective: Patient feeling worse today than yesterday. States that muscle pain is worse all over, and now his R knee  And L wrist hurt in addition to his R shoulder, hip, and ankle. He also notes that his fever seems to be constant and returns immediately after the Tylenol wears off. He denies any chest pain or difficulty breathing. Denies any rash. Denies testicular swelling or masses lately. He also noted that the Vicodin he had been getting wears off within 30 minutes, asking for something different. Objective: Vital signs in last 24 hours: Filed Vitals:   01/29/15 0228 01/29/15 0326 01/29/15 0600 01/29/15 0742  BP: 106/51   100/42  Pulse: 84   71  Temp: 102.9 F (39.4 C) 102.1 F (38.9 C) 99.7 F (37.6 C) 100.3 F (37.9 C)  TempSrc: Oral  Oral Oral  Resp: 19   16  Height:      Weight:      SpO2: 100%   97%   Weight change:   Intake/Output Summary (Last 24 hours) at 01/29/15 1054 Last data filed at 01/29/15 0800  Gross per 24 hour  Intake 3229.67 ml  Output   5600 ml  Net -2370.33 ml   BP 100/42 mmHg  Pulse 71  Temp(Src) 100.3 F (37.9 C) (Oral)  Resp 16  Ht 6' (1.829 m)  Wt 87.091 kg (192 lb)  BMI 26.03 kg/m2  SpO2 97% General appearance: alert, cooperative, appears stated age, fatigued and moderate distress Head: Normocephalic, without obvious abnormality, atraumatic Lungs: clear to auscultation bilaterally Heart: regular rate and rhythm, S1, S2 normal, no murmur, click, rub or gallop Abdomen: normal findings: no masses palpable, no organomegaly and umbilicus normal and abnormal findings:  moderate tenderness in the RUQ and in the RLQ Male genitalia: normal findings: scrotal contents normal to inspection and palpation, normal testes palpated bilaterally and no varicocele present Extremities: extremities normal, atraumatic, no cyanosis or edema and no evidence of synovitis Pulses: 2+ and symmetric Skin: Skin color, texture, turgor normal. No rashes or  lesions Neurologic: Grossly normal Lab Results: Results for orders placed or performed during the hospital encounter of 01/28/15 (from the past 24 hour(s))  MRSA PCR Screening     Status: None   Collection Time: 01/28/15  1:52 PM  Result Value Ref Range   MRSA by PCR NEGATIVE NEGATIVE  Save smear     Status: None   Collection Time: 01/28/15  3:00 PM  Result Value Ref Range   Smear Review SMEAR STAINED AND AVAILABLE FOR REVIEW   Sedimentation rate     Status: Abnormal   Collection Time: 01/28/15  3:20 PM  Result Value Ref Range   Sed Rate 17 (H) 0 - 16 mm/hr  C-reactive protein     Status: Abnormal   Collection Time: 01/28/15  3:20 PM  Result Value Ref Range   CRP 2.6 (H) <1.0 mg/dL  Ferritin     Status: None   Collection Time: 01/28/15  3:20 PM  Result Value Ref Range   Ferritin 127 24 - 336 ng/mL  Troponin I     Status: Abnormal   Collection Time: 01/29/15  2:57 AM  Result Value Ref Range   Troponin I 0.13 (H) <0.031 ng/mL  CBC     Status: Abnormal   Collection Time: 01/29/15  2:58 AM  Result Value Ref Range   WBC 3.8 (L) 4.0 - 10.5 K/uL   RBC 4.00 (L) 4.22 - 5.81 MIL/uL   Hemoglobin 12.4 (L) 13.0 -  17.0 g/dL   HCT 35.2 (L) 39.0 - 52.0 %   MCV 88.0 78.0 - 100.0 fL   MCH 31.0 26.0 - 34.0 pg   MCHC 35.2 30.0 - 36.0 g/dL   RDW 13.6 11.5 - 15.5 %   Platelets 132 (L) 150 - 400 K/uL  Basic metabolic panel     Status: Abnormal   Collection Time: 01/29/15  2:58 AM  Result Value Ref Range   Sodium 138 135 - 145 mmol/L   Potassium 3.4 (L) 3.5 - 5.1 mmol/L   Chloride 106 101 - 111 mmol/L   CO2 25 22 - 32 mmol/L   Glucose, Bld 107 (H) 65 - 99 mg/dL   BUN 5 (L) 6 - 20 mg/dL   Creatinine, Ser 0.95 0.61 - 1.24 mg/dL   Calcium 7.7 (L) 8.9 - 10.3 mg/dL   GFR calc non Af Amer >60 >60 mL/min   GFR calc Af Amer >60 >60 mL/min   Anion gap 7 5 - 15    Micro Results: Recent Results (from the past 240 hour(s))  Rapid strep screen     Status: None   Collection Time: 01/27/15 11:54  PM  Result Value Ref Range Status   Streptococcus, Group A Screen (Direct) NEGATIVE NEGATIVE Final    Comment: (NOTE) A Rapid Antigen test may result negative if the antigen level in the sample is below the detection level of this test. The FDA has not cleared this test as a stand-alone test therefore the rapid antigen negative result has reflexed to a Group A Strep culture.   MRSA PCR Screening     Status: None   Collection Time: 01/28/15  1:52 PM  Result Value Ref Range Status   MRSA by PCR NEGATIVE NEGATIVE Final    Comment:        The GeneXpert MRSA Assay (FDA approved for NASAL specimens only), is one component of a comprehensive MRSA colonization surveillance program. It is not intended to diagnose MRSA infection nor to guide or monitor treatment for MRSA infections.    Studies/Results: Dg Orthopantogram  01/28/2015   CLINICAL DATA:  Chronic right upper jaw pain.  EXAM: ORTHOPANTOGRAM/PANORAMIC  COMPARISON:  None.  FINDINGS: Teeth 1, 17 and 32 have been extracted. Retained tooth 16 appears impacted or barely erupted. There may be a very subtle caries involving tooth 2. No periapical lucencies identified. Other teeth appear unremarkable with evidence of prior dental hardware and several root canals. Or mandible and maxilla are unremarkable. No bony or dental mass lesions or cysts identified.  IMPRESSION: Remaining wisdom molar, tooth 16, appears impacted or barely erupted. There may be subtle dental caries involving tooth 2.   Electronically Signed   By: Aletta Edouard M.D.   On: 01/28/2015 13:23   Dg Chest 2 View  01/28/2015   CLINICAL DATA:  Fever and right-sided chest pain  EXAM: CHEST  2 VIEW  COMPARISON:  11/16/2014  FINDINGS: There is minimal linear scarring or atelectasis in the lateral left base. The lungs are otherwise clear. There is no pleural effusion. Pulmonary vasculature is normal. Hilar and mediastinal contours are unremarkable and unchanged.  IMPRESSION: Minimal  linear scarring or atelectasis in the lateral left base.   Electronically Signed   By: Andreas Newport M.D.   On: 01/28/2015 00:57   Dg Shoulder Right  01/28/2015   CLINICAL DATA:  39 year old male with history of right-sided shoulder pain yesterday evening, with some associated chills, nausea and fever.  EXAM: RIGHT SHOULDER -  2+ VIEW  COMPARISON:  No priors.  FINDINGS: There is no evidence of fracture or dislocation. There is no evidence of arthropathy or other focal bone abnormality. Soft tissues are unremarkable.  IMPRESSION: Negative.   Electronically Signed   By: Vinnie Langton M.D.   On: 01/28/2015 13:17   Ct Chest W Contrast  01/28/2015   CLINICAL DATA:  39 year old male with fever, body aches, nausea and diarrhea.  EXAM: CT CHEST, ABDOMEN, AND PELVIS WITH CONTRAST  TECHNIQUE: Multidetector CT imaging of the chest, abdomen and pelvis was performed following the standard protocol during bolus administration of intravenous contrast.  CONTRAST:  113m OMNIPAQUE IOHEXOL 300 MG/ML  SOLN  COMPARISON:  CT of the chest, CT of the chest 11/18/2014. CT of the abdomen and pelvis 11/17/2014.  FINDINGS: CT CHEST FINDINGS  Mediastinum/Lymph Nodes: Heart size is normal. There is no significant pericardial fluid, thickening or pericardial calcification. No pathologically enlarged mediastinal or hilar lymph nodes. Esophagus is unremarkable in appearance. No axillary lymphadenopathy.  Lungs/Pleura: Linear opacities in the left lung base favored to represent areas of scarring. No acute consolidative airspace disease. No pleural effusions. No suspicious appearing pulmonary nodules or masses.  Musculoskeletal/Soft Tissues: There are no aggressive appearing lytic or blastic lesions noted in the visualized portions of the skeleton.  CT ABDOMEN AND PELVIS FINDINGS  Hepatobiliary: Diffuse low attenuation throughout the hepatic parenchyma, compatible with hepatic steatosis. No cystic or solid hepatic lesions. No intra or  extrahepatic biliary ductal dilatation. Gallbladder is normal in appearance.  Pancreas: No pancreatic mass. No pancreatic ductal dilatation. No pancreatic or peripancreatic fluid or inflammatory changes.  Spleen: Spleen remains enlarged measuring 15.5 x 4.9 x 12.2 cm (estimated splenic volume of 463 mL).  Adrenals/Urinary Tract: Bilateral adrenal glands and bilateral kidneys are normal in appearance. No hydroureteronephrosis or perinephric stranding to indicate urinary tract obstruction at this time. Urinary bladder is normal in appearance.  Stomach/Bowel: Normal appearance of the stomach. No pathologic dilatation of small bowel or colon. Normal appendix.  Vascular/Lymphatic: No significant atherosclerotic disease, aneurysm or dissection identified in the abdominal or pelvic vasculature. No lymphadenopathy noted in the abdomen or pelvis.  Reproductive: Prostate gland and seminal vesicles are normal in appearance.  Other: No significant volume of ascites.  No pneumoperitoneum.  Musculoskeletal: There are no aggressive appearing lytic or blastic lesions noted in the visualized portions of the skeleton.  IMPRESSION: 1. No acute findings in the chest, abdomen or pelvis to account for the patient's symptoms. 2. Splenomegaly again noted, similar to the prior examination. 3. Mild scarring in the left lower lobe.   Electronically Signed   By: DVinnie LangtonM.D.   On: 01/28/2015 10:26   Ct Abdomen Pelvis W Contrast  01/28/2015   CLINICAL DATA:  39year old male with fever, body aches, nausea and diarrhea.  EXAM: CT CHEST, ABDOMEN, AND PELVIS WITH CONTRAST  TECHNIQUE: Multidetector CT imaging of the chest, abdomen and pelvis was performed following the standard protocol during bolus administration of intravenous contrast.  CONTRAST:  1014mOMNIPAQUE IOHEXOL 300 MG/ML  SOLN  COMPARISON:  CT of the chest, CT of the chest 11/18/2014. CT of the abdomen and pelvis 11/17/2014.  FINDINGS: CT CHEST FINDINGS  Mediastinum/Lymph  Nodes: Heart size is normal. There is no significant pericardial fluid, thickening or pericardial calcification. No pathologically enlarged mediastinal or hilar lymph nodes. Esophagus is unremarkable in appearance. No axillary lymphadenopathy.  Lungs/Pleura: Linear opacities in the left lung base favored to represent areas of scarring. No acute consolidative  airspace disease. No pleural effusions. No suspicious appearing pulmonary nodules or masses.  Musculoskeletal/Soft Tissues: There are no aggressive appearing lytic or blastic lesions noted in the visualized portions of the skeleton.  CT ABDOMEN AND PELVIS FINDINGS  Hepatobiliary: Diffuse low attenuation throughout the hepatic parenchyma, compatible with hepatic steatosis. No cystic or solid hepatic lesions. No intra or extrahepatic biliary ductal dilatation. Gallbladder is normal in appearance.  Pancreas: No pancreatic mass. No pancreatic ductal dilatation. No pancreatic or peripancreatic fluid or inflammatory changes.  Spleen: Spleen remains enlarged measuring 15.5 x 4.9 x 12.2 cm (estimated splenic volume of 463 mL).  Adrenals/Urinary Tract: Bilateral adrenal glands and bilateral kidneys are normal in appearance. No hydroureteronephrosis or perinephric stranding to indicate urinary tract obstruction at this time. Urinary bladder is normal in appearance.  Stomach/Bowel: Normal appearance of the stomach. No pathologic dilatation of small bowel or colon. Normal appendix.  Vascular/Lymphatic: No significant atherosclerotic disease, aneurysm or dissection identified in the abdominal or pelvic vasculature. No lymphadenopathy noted in the abdomen or pelvis.  Reproductive: Prostate gland and seminal vesicles are normal in appearance.  Other: No significant volume of ascites.  No pneumoperitoneum.  Musculoskeletal: There are no aggressive appearing lytic or blastic lesions noted in the visualized portions of the skeleton.  IMPRESSION: 1. No acute findings in the chest,  abdomen or pelvis to account for the patient's symptoms. 2. Splenomegaly again noted, similar to the prior examination. 3. Mild scarring in the left lower lobe.   Electronically Signed   By: Vinnie Langton M.D.   On: 01/28/2015 10:26   Medications: I have reviewed the patient's current medications. Scheduled Meds: . aspirin EC  325 mg Oral Daily  . enoxaparin (LOVENOX) injection  40 mg Subcutaneous Q24H  . sodium chloride  3 mL Intravenous Q12H   Continuous Infusions: . sodium chloride 125 mL/hr at 01/29/15 0800   PRN Meds:.acetaminophen **OR** acetaminophen, oxyCODONE Assessment/Plan: Principal Problem:   Fever Active Problems:   Sepsis   Myalgia   Right shoulder pain   Pyrexia   Arterial hypotension   Pain, dental  Fever and myalgias: Three months ago the patient was sent home after similar episode with the tentative diagnosis of viral etiology. Given the similar presentation and presence of a young child, viral etiology is again high on the differential. Due to joint involvement, septic arthritis is also on the differential as a can't miss diagnosis. Other causes include rheumatologic etiologies, including polymyositis and SLE, especially since family history is unknown.  - CBC w/ differential unremarkable, will follow daily - Blood smear review pending - Blood cultures x2 sent, f/u cultures from previous ED visit yesterday - Vanc and ceftazidime d/c due to likely viral etiology and no known source of infection - IVF for rehydration - Autoimmune work up (ANCA, ANA, CCP, Rheumatoid factor, ESR) pending - CT chest and abdomen only mild splenomegaly, otherwise unremarkable - Droplet precaution - Acetaminophen 529m q6h for fever - Oxycodone [id number] for myalgia and back pain  Joint pain:  - CT and xray negative for effusion or lytic lesions - Consulted OT/PT  Dental Pain: Patient noted that he had impacted wisdom teeth that have been hurting lately - Jaw xray showed minor  dental caries and impacted wisdom teeth of R, no signs of abcess - Exam of oral cavity unremarkable  Chest pain: The patient noted an episode of chest pain while in the ED that lasted about 5 minutes. Troponins at the time were negative, but EKG at that  time showed anterolateral T wave depressions, new from EKG the day prior. The pain resolved spontaneously and hasn't reoccurred, though tropinin this morning was elevated to 0.13, normal EKG. Due to hypotension and tachycardia, potentially demand ischemia. - Trend troponins this afternoon.    FEN/GI: - Diet: Regular - Replace electrolytes as needed (K<4, Mg<2)  Prophylaxis: - Lovenox 69m daily  Code: Full  Dispo: Home  This is a MCareers information officerNote.  The care of the patient was discussed with Dr. VZada Findersand the assessment and plan formulated with their assistance.  Please see their attached note for official documentation of the daily encounter.   LOS: 1 day   KSandria Manly Med Student 01/29/2015, 10:54 AM

## 2015-01-30 DIAGNOSIS — R161 Splenomegaly, not elsewhere classified: Secondary | ICD-10-CM

## 2015-01-30 LAB — CBC WITH DIFFERENTIAL/PLATELET
BASOS ABS: 0 10*3/uL (ref 0.0–0.1)
BASOS PCT: 1 % (ref 0–1)
Eosinophils Absolute: 0.1 10*3/uL (ref 0.0–0.7)
Eosinophils Relative: 3 % (ref 0–5)
HCT: 40.8 % (ref 39.0–52.0)
HEMOGLOBIN: 14.3 g/dL (ref 13.0–17.0)
LYMPHS ABS: 1.1 10*3/uL (ref 0.7–4.0)
LYMPHS PCT: 37 % (ref 12–46)
MCH: 30.6 pg (ref 26.0–34.0)
MCHC: 35 g/dL (ref 30.0–36.0)
MCV: 87.2 fL (ref 78.0–100.0)
Monocytes Absolute: 0.2 10*3/uL (ref 0.1–1.0)
Monocytes Relative: 7 % (ref 3–12)
NEUTROS ABS: 1.6 10*3/uL — AB (ref 1.7–7.7)
NEUTROS PCT: 52 % (ref 43–77)
PLATELETS: 141 10*3/uL — AB (ref 150–400)
RBC: 4.68 MIL/uL (ref 4.22–5.81)
RDW: 13.6 % (ref 11.5–15.5)
WBC: 3 10*3/uL — AB (ref 4.0–10.5)

## 2015-01-30 LAB — SAVE SMEAR

## 2015-01-30 LAB — MAGNESIUM: Magnesium: 1.7 mg/dL (ref 1.7–2.4)

## 2015-01-30 LAB — CK: Total CK: 115 U/L (ref 49–397)

## 2015-01-30 LAB — LACTATE DEHYDROGENASE: LDH: 178 U/L (ref 98–192)

## 2015-01-30 LAB — MPO/PR-3 (ANCA) ANTIBODIES: Myeloperoxidase Abs: <9 U/mL (ref 0.0–9.0)

## 2015-01-30 LAB — CMV IGM: CMV IgM: <30 AU/mL (ref 0.0–29.9)

## 2015-01-30 MED ORDER — SODIUM CHLORIDE 0.9 % IV BOLUS (SEPSIS)
1000.0000 mL | Freq: Once | INTRAVENOUS | Status: AC
  Administered 2015-01-30: 1000 mL via INTRAVENOUS

## 2015-01-30 MED ORDER — POTASSIUM CHLORIDE 10 MEQ/100ML IV SOLN
10.0000 meq | INTRAVENOUS | Status: DC
  Administered 2015-01-30: 10 meq via INTRAVENOUS
  Filled 2015-01-30 (×2): qty 100

## 2015-01-30 MED ORDER — POTASSIUM CHLORIDE CRYS ER 20 MEQ PO TBCR
40.0000 meq | EXTENDED_RELEASE_TABLET | Freq: Two times a day (BID) | ORAL | Status: DC
  Administered 2015-01-30 – 2015-01-31 (×3): 40 meq via ORAL
  Filled 2015-01-30 (×3): qty 2

## 2015-01-30 MED ORDER — BACITRACIN-NEOMYCIN-POLYMYXIN 400-5-5000 EX OINT
TOPICAL_OINTMENT | CUTANEOUS | Status: AC
Start: 2015-01-30 — End: 2015-01-30
  Administered 2015-01-30: 1
  Filled 2015-01-30: qty 1

## 2015-01-30 NOTE — Progress Notes (Signed)
Subjective: Patient stated he was in severe pain and that his myalgias were diffusely worse. He localized the most severe pain to his IV site, where he was receiving IV potassium. Upon d/c of IV potassium, he stated he felt much better and was not in as much pain. Denied any difficulty breathing or chest pain. Endorsed some headache but no vision changes. Still has not had a bowel movement since 8/5, though no difficultly with urinating. Overall feels like his symptoms are improving.   Objective: Vital signs in last 24 hours: Filed Vitals:   01/30/15 0421 01/30/15 0500 01/30/15 0700 01/30/15 0747  BP:  81/43 105/61 109/70  Pulse:    66  Temp: 97.9 F (36.6 C)   98.5 F (36.9 C)  TempSrc: Oral   Axillary  Resp:    28  Height:      Weight:      SpO2:    100%   Weight change:   Intake/Output Summary (Last 24 hours) at 01/30/15 1138 Last data filed at 01/30/15 3570  Gross per 24 hour  Intake   3073 ml  Output   5275 ml  Net  -2202 ml   BP 109/70 mmHg  Pulse 66  Temp(Src) 98.5 F (36.9 C) (Axillary)  Resp 28  Ht 6' (1.829 m)  Wt 87.091 kg (192 lb)  BMI 26.03 kg/m2  SpO2 100%  General Appearance:    Alert, cooperative, no distress, appears stated age  Head:    Normocephalic, without obvious abnormality, atraumatic  Eyes:    PERRL, conjunctiva/corneas clear, EOM's intact  Neck:   Supple, symmetrical, trachea midline, no adenopathy;       thyroid:  No enlargement/tenderness/nodules  Back:     Symmetric, no curvature, ROM normal  Lungs:     Clear to auscultation bilaterally, respirations unlabored  Chest wall:    No tenderness or deformity  Heart:    Regular rate and rhythm, S1 and S2 normal, no murmur, rub   or gallop  Abdomen:     Soft, non-tender, bowel sounds active all four quadrants,    no masses, no palpable organomegaly  Genitalia:    Normal male without lesion, discharge or tenderness  Extremities:   Extremities normal, atraumatic, no cyanosis or edema  Pulses:    2+ and symmetric all extremities  Skin:   Skin color, texture, turgor normal, no rashes or lesions  Lymph nodes:   Cervical, supraclavicular, and axillary nodes normal   Lab Results: Results for orders placed or performed during the hospital encounter of 01/28/15 (from the past 24 hour(s))  Troponin I     Status: Abnormal   Collection Time: 01/29/15 12:36 PM  Result Value Ref Range   Troponin I 0.10 (H) <0.031 ng/mL  Magnesium     Status: None   Collection Time: 01/30/15  7:14 AM  Result Value Ref Range   Magnesium 1.7 1.7 - 2.4 mg/dL  CK     Status: None   Collection Time: 01/30/15  7:14 AM  Result Value Ref Range   Total CK 115 49 - 397 U/L   Micro Results: Recent Results (from the past 240 hour(s))  Culture, blood (routine x 2)     Status: None (Preliminary result)   Collection Time: 01/27/15 10:50 PM  Result Value Ref Range Status   Specimen Description BLOOD LEFT ARM  Final   Special Requests BOTTLES DRAWN AEROBIC AND ANAEROBIC 5 CC  Final   Culture NO GROWTH 1 DAY  Final  Report Status PENDING  Incomplete  Culture, blood (routine x 2)     Status: None (Preliminary result)   Collection Time: 01/27/15 11:05 PM  Result Value Ref Range Status   Specimen Description BLOOD RIGHT ARM  Final   Special Requests BOTTLES DRAWN AEROBIC AND ANAEROBIC 5 CC  Final   Culture NO GROWTH 1 DAY  Final   Report Status PENDING  Incomplete  Rapid strep screen     Status: None   Collection Time: 01/27/15 11:54 PM  Result Value Ref Range Status   Streptococcus, Group A Screen (Direct) NEGATIVE NEGATIVE Final    Comment: (NOTE) A Rapid Antigen test may result negative if the antigen level in the sample is below the detection level of this test. The FDA has not cleared this test as a stand-alone test therefore the rapid antigen negative result has reflexed to a Group A Strep culture.   Culture, Group A Strep     Status: None   Collection Time: 01/27/15 11:54 PM  Result Value Ref Range  Status   Strep A Culture Comment  Final    Comment: (NOTE) Microbiological testing to rule out the presence of possible pathogens is in progress. Performed At: [facility] [place], [place] [id number] Lindon Romp MD [id number]   Urine culture     Status: None   Collection Time: 01/28/15 12:55 AM  Result Value Ref Range Status   Specimen Description URINE, CLEAN CATCH  Final   Special Requests NONE  Final   Culture NO GROWTH 1 DAY  Final   Report Status 01/29/2015 FINAL  Final  Blood culture (routine x 2)     Status: None (Preliminary result)   Collection Time: 01/28/15  9:09 AM  Result Value Ref Range Status   Specimen Description BLOOD LEFT FOREARM  Final   Special Requests BOTTLES DRAWN AEROBIC AND ANAEROBIC 5ML  Final   Culture NO GROWTH 1 DAY  Final   Report Status PENDING  Incomplete  Blood culture (routine x 2)     Status: None (Preliminary result)   Collection Time: 01/28/15  9:09 AM  Result Value Ref Range Status   Specimen Description BLOOD LEFT FOREARM  Final   Special Requests BOTTLES DRAWN AEROBIC AND ANAEROBIC 5CC  Final   Culture NO GROWTH 1 DAY  Final   Report Status PENDING  Incomplete  MRSA PCR Screening     Status: None   Collection Time: 01/28/15  1:52 PM  Result Value Ref Range Status   MRSA by PCR NEGATIVE NEGATIVE Final    Comment:        The GeneXpert MRSA Assay (FDA approved for NASAL specimens only), is one component of a comprehensive MRSA colonization surveillance program. It is not intended to diagnose MRSA infection nor to guide or monitor treatment for MRSA infections.    Studies/Results: Dg Orthopantogram  01/28/2015   CLINICAL DATA:  Chronic right upper jaw pain.  EXAM: ORTHOPANTOGRAM/PANORAMIC  COMPARISON:  None.  FINDINGS: Teeth 1, 17 and 32 have been extracted. Retained tooth 16 appears impacted or barely erupted. There may be a very subtle caries involving tooth 2. No periapical lucencies identified.  Other teeth appear unremarkable with evidence of prior dental hardware and several root canals. Or mandible and maxilla are unremarkable. No bony or dental mass lesions or cysts identified.  IMPRESSION: Remaining wisdom molar, tooth 16, appears impacted or barely erupted. There may be subtle dental caries involving tooth 2.   Electronically  Signed   By: Aletta Edouard M.D.   On: 01/28/2015 13:23   Dg Shoulder Right  01/28/2015   CLINICAL DATA:  39 year old male with history of right-sided shoulder pain yesterday evening, with some associated chills, nausea and fever.  EXAM: RIGHT SHOULDER - 2+ VIEW  COMPARISON:  No priors.  FINDINGS: There is no evidence of fracture or dislocation. There is no evidence of arthropathy or other focal bone abnormality. Soft tissues are unremarkable.  IMPRESSION: Negative.   Electronically Signed   By: Vinnie Langton M.D.   On: 01/28/2015 13:17   Medications: I have reviewed the patient's current medications. Scheduled Meds: . aspirin EC  325 mg Oral Daily  . enoxaparin (LOVENOX) injection  40 mg Subcutaneous Q24H  . potassium chloride  40 mEq Oral BID  . sodium chloride  3 mL Intravenous Q12H   Continuous Infusions: . sodium chloride 125 mL/hr (01/30/15 0006)   PRN Meds:.acetaminophen **OR** acetaminophen, ibuprofen, oxyCODONE Assessment/Plan: Principal Problem:   Fever Active Problems:   Sepsis   Myalgia   Right shoulder pain   Pyrexia   Arterial hypotension   Pain, dental   Polyarthralgia   Elevated troponin  Fever and myalgias: Three months ago the patient was sent home after similar episode with the tentative diagnosis of viral etiology. Given the similar presentation and presence of a young child, viral etiology is again high on the differential. Due to joint involvement, septic arthritis is also on the differential as a can't miss diagnosis. Other causes include rheumatologic etiologies, including polymyositis and SLE, especially since family  history is unknown.  - CBC w/ differential unremarkable, will follow daily - Blood smear preliminary review showed lymphopenia and abnormal neutrophils. Path review pending, will obtain fresh smear for additional review - Blood cultures x2 sent, NGTD x 2 days - Vanc and ceftazidime d/c due to likely viral etiology and no known source of infection - IVF for rehydration - Autoimmune work up (ANCA, ANA, CCP, ESR) pending - Rheumatoid factor negative - LDH normal - CMV IgM normal - CT chest and abdomen only mild splenomegaly, otherwise unremarkable - Droplet precaution - Acetaminophen 599m q6h for fever - Ibuprofen 4060mq6h PRN for fever or pain - Oxycodone 5-1094m4h for myalgia and back pain  - f/u EBV panel - f/u CMV panel - f/u respiratory viral panel  Joint pain:  - CT and xray negative for effusion or lytic lesions - Consulted OT/PT  Dental Pain: Patient noted that he had impacted wisdom teeth that have been hurting lately - Jaw xray showed minor dental caries and impacted wisdom teeth of R, no signs of abcess - Exam of oral cavity unremarkable  Chest pain: The patient noted an episode of chest pain while in the ED that lasted about 5 minutes. Troponins at the time were negative, but EKG at that time showed anterolateral T wave depressions, new from EKG the day prior. The pain resolved spontaneously and hasn't reoccurred, though tropinin yesterday morning was elevated to 0.13, normal EKG. Due to hypotension and tachycardia, potentially demand ischemia.  - Followup troponin 0.10, not likely CAD  FEN/GI: - Diet: Regular - Replace electrolytes as needed (K<4, Mg<2)  Prophylaxis: - Lovenox 19m23mily  Code: Full  Dispo: Home  This is a [employer] information officere.  The care of the patient was discussed with Dr. VishZada Finders the assessment and plan formulated with their assistance.  Please see their attached note for official documentation of the daily encounter.  LOS: 2  days   Sandria Manly, Med Student 01/30/2015, 11:38 AM

## 2015-01-30 NOTE — Progress Notes (Signed)
Subjective:  Patient had drop in BP overnight to 81/43 after he had a dose of Oxycodone 10 mg at bedtime. He was given bolus of 1L NS which improved his blood pressure. During initial exam today, patient states he was not doing well and clutching his left arm at IV site. He was being transfused at the time with KCl which was causing his pain. IV KCl was stopped and switched to a one time oral dose of 40 mEq. Patient's distress improved quickly afterwards. Continues to have general myalgias with some improvement in his arthralgias. Objective: Vital signs in last 24 hours: Filed Vitals:   01/30/15 0500 01/30/15 0700 01/30/15 0747 01/30/15 1404  BP: 81/43 105/61 109/70 110/75  Pulse:   66 83  Temp:   98.5 F (36.9 C) 97.5 F (36.4 C)  TempSrc:   Axillary Axillary  Resp:   28 18  Height:      Weight:      SpO2:   100% 100%   Weight change:   Intake/Output Summary (Last 24 hours) at 01/30/15 1425 Last data filed at 01/30/15 1300  Gross per 24 hour  Intake   2833 ml  Output   4425 ml  Net  -1592 ml   General: resting in bed, moderate distress HEENT: EOMI, no scleral icterus Cardiac: RRR, no rubs, murmurs or gallops Pulm: clear to auscultation bilaterally Abd: soft, nontender to palpation, nondistended Ext/musculoskeletal: warm and well perfused, no pedal edema, tender right knee, patient has difficulty with general movement due to pain, he has tenderness to palpation of muscles generally throughout his body. Skin: no rashes or lesions Neuro: alert and oriented X3, decreased grip strength  Lab Results: Basic Metabolic Panel:  Recent Labs Lab 01/28/15 0906 01/29/15 0258 01/30/15 0714  NA 137 138  --   K 3.4* 3.4*  --   CL 105 106  --   CO2 22 25  --   GLUCOSE 106* 107*  --   BUN 9 5*  --   CREATININE 0.97 0.95  --   CALCIUM 8.7* 7.7*  --   MG  --   --  1.7   Liver Function Tests:  Recent Labs Lab 01/27/15 2250 01/28/15 0906  AST 48* 41  ALT 55 48  ALKPHOS 80  60  BILITOT 0.9 1.3*  PROT 7.9 6.2*  ALBUMIN 4.4 3.7   CBC:  Recent Labs Lab 01/27/15 2250 01/28/15 0906 01/29/15 0258  WBC 5.6 5.4 3.8*  NEUTROABS 4.5 4.3  --   HGB 15.2 13.4 12.4*  HCT 42.0 37.6* 35.2*  MCV 86.1 85.5 88.0  PLT 183 160 132*   Cardiac Enzymes:  Recent Labs Lab 01/27/15 2250 01/28/15 0906 01/29/15 0257 01/29/15 1236 01/30/15 0714  CKTOTAL 82  --   --   --  115  TROPONINI  --  <0.03 0.13* 0.10*  --    Anemia Panel:  Recent Labs Lab 01/28/15 1520  FERRITIN 127   Urine Drug Screen: Drugs of Abuse     Component Value Date/Time   LABOPIA NONE DETECTED 11/17/2014 0628   COCAINSCRNUR NONE DETECTED 11/17/2014 0628   LABBENZ NONE DETECTED 11/17/2014 0628   AMPHETMU POSITIVE* 11/17/2014 0628   THCU NONE DETECTED 11/17/2014 0628   LABBARB NONE DETECTED 11/17/2014 0628    Urinalysis:  Recent Labs Lab 01/28/15 0055  COLORURINE YELLOW  LABSPEC 1.017  PHURINE 6.5  GLUCOSEU NEGATIVE  HGBUR NEGATIVE  BILIRUBINUR NEGATIVE  KETONESUR NEGATIVE  PROTEINUR NEGATIVE  UROBILINOGEN 0.2  NITRITE NEGATIVE  LEUKOCYTESUR NEGATIVE    Micro Results: Recent Results (from the past 240 hour(s))  Culture, blood (routine x 2)     Status: None (Preliminary result)   Collection Time: 01/27/15 10:50 PM  Result Value Ref Range Status   Specimen Description BLOOD LEFT ARM  Final   Special Requests BOTTLES DRAWN AEROBIC AND ANAEROBIC 5 CC  Final   Culture NO GROWTH 1 DAY  Final   Report Status PENDING  Incomplete  Culture, blood (routine x 2)     Status: None (Preliminary result)   Collection Time: 01/27/15 11:05 PM  Result Value Ref Range Status   Specimen Description BLOOD RIGHT ARM  Final   Special Requests BOTTLES DRAWN AEROBIC AND ANAEROBIC 5 CC  Final   Culture NO GROWTH 1 DAY  Final   Report Status PENDING  Incomplete  Rapid strep screen     Status: None   Collection Time: 01/27/15 11:54 PM  Result Value Ref Range Status   Streptococcus, Group A  Screen (Direct) NEGATIVE NEGATIVE Final    Comment: (NOTE) A Rapid Antigen test may result negative if the antigen level in the sample is below the detection level of this test. The FDA has not cleared this test as a stand-alone test therefore the rapid antigen negative result has reflexed to a Group A Strep culture.   Culture, Group A Strep     Status: None   Collection Time: 01/27/15 11:54 PM  Result Value Ref Range Status   Strep A Culture Comment  Final    Comment: (NOTE) Microbiological testing to rule out the presence of possible pathogens is in progress. Performed At: [facility] [place], [place] [id number] Lindon Romp MD [id number]   Urine culture     Status: None   Collection Time: 01/28/15 12:55 AM  Result Value Ref Range Status   Specimen Description URINE, CLEAN CATCH  Final   Special Requests NONE  Final   Culture NO GROWTH 1 DAY  Final   Report Status 01/29/2015 FINAL  Final  Blood culture (routine x 2)     Status: None (Preliminary result)   Collection Time: 01/28/15  9:09 AM  Result Value Ref Range Status   Specimen Description BLOOD LEFT FOREARM  Final   Special Requests BOTTLES DRAWN AEROBIC AND ANAEROBIC 5ML  Final   Culture NO GROWTH 1 DAY  Final   Report Status PENDING  Incomplete  Blood culture (routine x 2)     Status: None (Preliminary result)   Collection Time: 01/28/15  9:09 AM  Result Value Ref Range Status   Specimen Description BLOOD LEFT FOREARM  Final   Special Requests BOTTLES DRAWN AEROBIC AND ANAEROBIC 5CC  Final   Culture NO GROWTH 1 DAY  Final   Report Status PENDING  Incomplete  MRSA PCR Screening     Status: None   Collection Time: 01/28/15  1:52 PM  Result Value Ref Range Status   MRSA by PCR NEGATIVE NEGATIVE Final    Comment:        The GeneXpert MRSA Assay (FDA approved for NASAL specimens only), is one component of a comprehensive MRSA colonization surveillance program. It is not intended to  diagnose MRSA infection nor to guide or monitor treatment for MRSA infections.    Studies/Results: No results found. Medications: I have reviewed the patient's current medications. Scheduled Meds: . aspirin EC  325 mg Oral Daily  . enoxaparin (LOVENOX) injection  40 mg  Subcutaneous Q24H  . potassium chloride  40 mEq Oral BID  . sodium chloride  3 mL Intravenous Q12H   Continuous Infusions: . sodium chloride 125 mL/hr (01/30/15 0006)   PRN Meds:.acetaminophen **OR** acetaminophen, ibuprofen, oxyCODONE Assessment/Plan: Principal Problem:   Fever Active Problems:   Sepsis   Myalgia   Right shoulder pain   Pyrexia   Arterial hypotension   Pain, dental   Polyarthralgia   Elevated troponin  Fever of unknown oirgin: Patient had similar more severe episode in May 2016 with no definitive diagnosis after extensive workup, finally referred to as fever of unknown origin. Imaging in ED mostly negative except for splenomegaly similar to prior exam as well as mild scarring in left lower lobe.  No obvious sign of bacterial infection, more likely to be inflammatory or viral cause, with suspicion for malignant process.   Reviewed smear with Dr. Beryle Beams which shows lymphopenia and many abnormal neutrophils. As slide has been sitting for several days we will collect another CBC and save smear to review again. Concern for lymphoma or other malignant process will be pursued, and depending on lab results, may consider bone marrow biopsy in the future.  -CBC w/ diff, save smear -Blood Cultures x2 obtained in ED prior to abx, awaiting results. -F/U Respiratory virus panel -LDH -EBV panel -CMV panel (results: IgM negative) -Tylenol 650 mg q6h prn for pain/fever, or Ibuprofen 400 mg -Oxycodone 5 mg q4h prn for pain, may consider increase to 10 mg if pain not controlled -IV NS 125 mL/hr -droplet precaution until repiratory viral panel results  Myalgia: Patient's generalized muscle pains are  worse this morning. Non tender to palpation of muscles. ESR, CRP, Ferritin come back unremarkable. We are considering this to be either an inflammatory or viral cause, will continue supportive care and explore possible entities. -f/u ANA -f/u CCP -f/u RF (result: negative) -f/u ANCA  Right shoulder pain: Considered possible septic process, exam and imaging do not support this. -CT Chest reviewed with radiology for limited view of right shoulder, no obvious pathology noted on limited view -Right shoulder x-ray - negative  Pain, dental: Right upper molar pain considered to have abscess or cellulitis component, but no evidence from exam or imaging. -Orthopantogram shows only subtle caries involving tooth 2.   Dispo: Disposition is deferred at this time, awaiting improvement of current medical problems.  Anticipated discharge in approximately 2-3 day(s).   The patient does not have a current PCP (No primary care provider on file.) and does need an [facility] hospital follow-up appointment after discharge.  The patient does not have transportation limitations that hinder transportation to clinic appointments.  .Services Needed at time of discharge: Y = Yes, Blank = No PT:   OT:   RN:   Equipment:   Other:     LOS: 2 days   Zada Finders, MD 01/30/2015, 2:25 PM

## 2015-01-30 NOTE — Progress Notes (Signed)
INTERNAL MEDICINE TEACHING SERVICE Night Float Progress Note   Subjective:    We were called overnight by the RN for evaluation of hypotension. At time of evaluation, pt had blood pressure of 81/43.  His pressures have been soft throughout the night ranging from 81-89/43-58.  He received  oxycodone PO at 2313.  Also complains of generalized weakness all over.  He has been afebrile throughout the night.  Joanie CoddingtonCeasar Mons Vitals:   01/30/15 0500  BP: 81/43  Pulse:   Temp:   Resp:        Assessment/ Plan:    -1L NS IV fluid bolus now -check CK, Magnesium   Gwynn Burly, DO  01/30/2015, 6:04 AM

## 2015-01-31 ENCOUNTER — Encounter: Payer: Self-pay | Admitting: Internal Medicine

## 2015-01-31 DIAGNOSIS — R509 Fever, unspecified: Principal | ICD-10-CM

## 2015-01-31 LAB — CBC WITH DIFFERENTIAL/PLATELET
BASOS ABS: 0 10*3/uL (ref 0.0–0.1)
BASOS PCT: 1 % (ref 0–1)
EOS ABS: 0.2 10*3/uL (ref 0.0–0.7)
Eosinophils Relative: 5 % (ref 0–5)
HCT: 38.9 % — ABNORMAL LOW (ref 39.0–52.0)
HEMOGLOBIN: 13.7 g/dL (ref 13.0–17.0)
LYMPHS PCT: 34 % (ref 12–46)
Lymphs Abs: 1.5 10*3/uL (ref 0.7–4.0)
MCH: 30.4 pg (ref 26.0–34.0)
MCHC: 35.2 g/dL (ref 30.0–36.0)
MCV: 86.4 fL (ref 78.0–100.0)
MONOS PCT: 7 % (ref 3–12)
Monocytes Absolute: 0.3 10*3/uL (ref 0.1–1.0)
NEUTROS PCT: 53 % (ref 43–77)
Neutro Abs: 2.4 10*3/uL (ref 1.7–7.7)
PLATELETS: 147 10*3/uL — AB (ref 150–400)
RBC: 4.5 MIL/uL (ref 4.22–5.81)
RDW: 13.5 % (ref 11.5–15.5)
WBC: 4.4 10*3/uL (ref 4.0–10.5)

## 2015-01-31 LAB — HEPATITIS PANEL, ACUTE
HEP B S AG: NEGATIVE
Hep A IgM: NEGATIVE
Hep B C IgM: NEGATIVE

## 2015-01-31 LAB — RESPIRATORY VIRUS PANEL
ADENOVIRUS: NEGATIVE
Influenza A: NEGATIVE
Influenza B: NEGATIVE
Metapneumovirus: NEGATIVE
Parainfluenza 1: NEGATIVE
Parainfluenza 2: NEGATIVE
Parainfluenza 3: NEGATIVE
Respiratory Syncytial Virus A: NEGATIVE
Respiratory Syncytial Virus B: NEGATIVE
Rhinovirus: NEGATIVE

## 2015-01-31 LAB — EPSTEIN-BARR VIRUS VCA ANTIBODY PANEL
EBV EARLY ANTIGEN AB, IGG: 65.8 U/mL — AB (ref 0.0–8.9)
EBV NA IgG: 492 U/mL — ABNORMAL HIGH (ref 0.0–17.9)
EBV VCA IGG: 240 U/mL — AB (ref 0.0–17.9)
EBV VCA IgM: <36 U/mL (ref 0.0–35.9)

## 2015-01-31 LAB — ANTINUCLEAR ANTIBODIES, IFA: ANA Ab, IFA: NEGATIVE

## 2015-01-31 LAB — ANCA TITERS
Atypical P-ANCA titer: <1:20 {titer}
C-ANCA: <1:20 {titer}
P-ANCA: <1:20 {titer}

## 2015-01-31 LAB — CMV ANTIBODY, IGG (EIA): CMV Ab - IgG: >10 U/mL — ABNORMAL HIGH (ref 0.00–0.59)

## 2015-01-31 LAB — CULTURE, GROUP A STREP

## 2015-01-31 LAB — PATHOLOGIST SMEAR REVIEW

## 2015-01-31 MED ORDER — ACETAMINOPHEN 500 MG PO TABS: 500.0000 mg | ORAL_TABLET | Freq: Four times a day (QID) | ORAL | Status: AC | PRN

## 2015-01-31 MED ORDER — OXYCODONE HCL 5 MG PO TABS: 5.0000 mg | ORAL_TABLET | Freq: Four times a day (QID) | ORAL | Status: DC | PRN

## 2015-01-31 MED ORDER — ONDANSETRON HCL 4 MG/2ML IJ SOLN
4.0000 mg | Freq: Once | INTRAMUSCULAR | Status: AC
  Administered 2015-01-31: 4 mg via INTRAVENOUS

## 2015-01-31 MED ORDER — IBUPROFEN 400 MG PO TABS: 400.0000 mg | ORAL_TABLET | Freq: Four times a day (QID) | ORAL | Status: DC | PRN

## 2015-01-31 MED ORDER — ONDANSETRON HCL 4 MG/2ML IJ SOLN
INTRAMUSCULAR | Status: AC
  Administered 2015-01-31: 4 mg
  Filled 2015-01-31: qty 2

## 2015-01-31 NOTE — Progress Notes (Signed)
Subjective: Patient feeling much better today, though has some myalgias last night. No fever or chills overnight. No arthralgias present today. Would like to leave the hospital today. Has followup with a new PCP in two weeks.  Objective: Vital signs in last 24 hours: Filed Vitals:   01/30/15 0747 01/30/15 1404 01/30/15 2229 01/31/15 0544  BP: 109/70 110/75 106/63 95/58  Pulse: 66 83 57 57  Temp: 98.5 F (36.9 C) 97.5 F (36.4 C) 97.9 F (36.6 C) 98.5 F (36.9 C)  TempSrc: Axillary Axillary Oral   Resp: _0 Height:      Weight:      SpO2: 100% 100% 99% 97%   Weight change:   Intake/Output Summary (Last 24 hours) at 01/31/15 0804 Last data filed at 01/31/15 0752  Gross per 24 hour  Intake 2899.92 ml  Output   3175 ml  Net -275.08 ml   BP 95/58 mmHg  Pulse 57  Temp(Src) 98.5 F (36.9 C) (Oral)  Resp 16  Ht 6' (1.829 m)  Wt 87.091 kg (192 lb)  BMI 26.03 kg/m2  SpO2 97%  General Appearance:    Alert, cooperative, no distress, appears stated age  Lungs:     Clear to auscultation bilaterally, respirations unlabored  Chest wall:    No tenderness or deformity  Heart:    Regular rate and rhythm, S1 and S2 normal, no murmur, rub   or gallop  Abdomen:     Soft, non-tender no masses, no organomegaly  Extremities:   Extremities normal, atraumatic, no cyanosis or edema  Pulses:   2+ and symmetric all extremities  Skin:   Skin color, texture, turgor normal, no rashes or lesions   Lab Results: Results for orders placed or performed during the hospital encounter of 01/28/15 (from the past 24 hour(s))  Lactate dehydrogenase     Status: None   Collection Time: 01/30/15  1:15 PM  Result Value Ref Range   LDH 178 98 - 192 U/L  Hepatitis panel, acute     Status: None   Collection Time: 01/30/15  1:15 PM  Result Value Ref Range   Hepatitis B Surface Ag Negative Negative   HCV Ab <0.1 0.0 - 0.9 s/co ratio   Hep A IgM Negative Negative   Hep B C IgM Negative Negative    CBC with Differential     Status: Abnormal   Collection Time: 01/30/15  2:08 PM  Result Value Ref Range   WBC 3.0 (L) 4.0 - 10.5 K/uL   RBC 4.68 4.22 - 5.81 MIL/uL   Hemoglobin 14.3 13.0 - 17.0 g/dL   HCT 40.8 39.0 - 52.0 %   MCV 87.2 78.0 - 100.0 fL   MCH 30.6 26.0 - 34.0 pg   MCHC 35.0 30.0 - 36.0 g/dL   RDW 13.6 11.5 - 15.5 %   Platelets 141 (L) 150 - 400 K/uL   Neutrophils Relative % 52 43 - 77 %   Neutro Abs 1.6 (L) 1.7 - 7.7 K/uL   Lymphocytes Relative 37 12 - 46 %   Lymphs Abs 1.1 0.7 - 4.0 K/uL   Monocytes Relative 7 3 - 12 %   Monocytes Absolute 0.2 0.1 - 1.0 K/uL   Eosinophils Relative 3 0 - 5 %   Eosinophils Absolute 0.1 0.0 - 0.7 K/uL   Basophils Relative 1 0 - 1 %   Basophils Absolute 0.0 0.0 - 0.1 K/uL  Save smear     Status: None  Collection Time: 01/30/15  2:08 PM  Result Value Ref Range   Smear Review SMEAR STAINED AND AVAILABLE FOR REVIEW   CBC with Differential/Platelet     Status: Abnormal   Collection Time: 01/31/15  6:31 AM  Result Value Ref Range   WBC 4.4 4.0 - 10.5 K/uL   RBC 4.50 4.22 - 5.81 MIL/uL   Hemoglobin 13.7 13.0 - 17.0 g/dL   HCT 38.9 (L) 39.0 - 52.0 %   MCV 86.4 78.0 - 100.0 fL   MCH 30.4 26.0 - 34.0 pg   MCHC 35.2 30.0 - 36.0 g/dL   RDW 13.5 11.5 - 15.5 %   Platelets 147 (L) 150 - 400 K/uL   Neutrophils Relative % 53 43 - 77 %   Neutro Abs 2.4 1.7 - 7.7 K/uL   Lymphocytes Relative 34 12 - 46 %   Lymphs Abs 1.5 0.7 - 4.0 K/uL   Monocytes Relative 7 3 - 12 %   Monocytes Absolute 0.3 0.1 - 1.0 K/uL   Eosinophils Relative 5 0 - 5 %   Eosinophils Absolute 0.2 0.0 - 0.7 K/uL   Basophils Relative 1 0 - 1 %   Basophils Absolute 0.0 0.0 - 0.1 K/uL   Micro Results: Recent Results (from the past 240 hour(s))  Culture, blood (routine x 2)     Status: None (Preliminary result)   Collection Time: 01/27/15 10:50 PM  Result Value Ref Range Status   Specimen Description BLOOD LEFT ARM  Final   Special Requests BOTTLES DRAWN AEROBIC AND  ANAEROBIC 5 CC  Final   Culture NO GROWTH 2 DAYS  Final   Report Status PENDING  Incomplete  Culture, blood (routine x 2)     Status: None (Preliminary result)   Collection Time: 01/27/15 11:05 PM  Result Value Ref Range Status   Specimen Description BLOOD RIGHT ARM  Final   Special Requests BOTTLES DRAWN AEROBIC AND ANAEROBIC 5 CC  Final   Culture NO GROWTH 2 DAYS  Final   Report Status PENDING  Incomplete  Rapid strep screen     Status: None   Collection Time: 01/27/15 11:54 PM  Result Value Ref Range Status   Streptococcus, Group A Screen (Direct) NEGATIVE NEGATIVE Final    Comment: (NOTE) A Rapid Antigen test may result negative if the antigen level in the sample is below the detection level of this test. The FDA has not cleared this test as a stand-alone test therefore the rapid antigen negative result has reflexed to a Group A Strep culture.   Culture, Group A Strep     Status: None   Collection Time: 01/27/15 11:54 PM  Result Value Ref Range Status   Strep A Culture Comment  Final    Comment: (NOTE) Microbiological testing to rule out the presence of possible pathogens is in progress. Performed At: [facility] [place], [place] [id number] Lindon Romp MD WU:[id number]   Urine culture     Status: None   Collection Time: 01/28/15 12:55 AM  Result Value Ref Range Status   Specimen Description URINE, CLEAN CATCH  Final   Special Requests NONE  Final   Culture NO GROWTH 1 DAY  Final   Report Status 01/29/2015 FINAL  Final  Blood culture (routine x 2)     Status: None (Preliminary result)   Collection Time: 01/28/15  9:09 AM  Result Value Ref Range Status   Specimen Description BLOOD LEFT FOREARM  Final   Special  Requests BOTTLES DRAWN AEROBIC AND ANAEROBIC 5ML  Final   Culture NO GROWTH 2 DAYS  Final   Report Status PENDING  Incomplete  Blood culture (routine x 2)     Status: None (Preliminary result)   Collection Time: 01/28/15  9:09 AM    Result Value Ref Range Status   Specimen Description BLOOD LEFT FOREARM  Final   Special Requests BOTTLES DRAWN AEROBIC AND ANAEROBIC 5CC  Final   Culture NO GROWTH 2 DAYS  Final   Report Status PENDING  Incomplete  Respiratory virus panel     Status: None   Collection Time: 01/28/15 11:36 AM  Result Value Ref Range Status   Source - RVPAN NASOPHARYNGEAL  Corrected   Respiratory Syncytial Virus A Negative Negative Final   Respiratory Syncytial Virus B Negative Negative Final   Influenza A Negative Negative Final   Influenza B Negative Negative Final   Parainfluenza 1 Negative Negative Final   Parainfluenza 2 Negative Negative Final   Parainfluenza 3 Negative Negative Final   Metapneumovirus Negative Negative Final   Rhinovirus Negative Negative Final   Adenovirus Negative Negative Final    Comment: (NOTE) Performed At: [facility] [place], [place] [id number] Lindon Romp MD QP:[id number]   MRSA PCR Screening     Status: None   Collection Time: 01/28/15  1:52 PM  Result Value Ref Range Status   MRSA by PCR NEGATIVE NEGATIVE Final    Comment:        The GeneXpert MRSA Assay (FDA approved for NASAL specimens only), is one component of a comprehensive MRSA colonization surveillance program. It is not intended to diagnose MRSA infection nor to guide or monitor treatment for MRSA infections.    Studies/Results: No results found. Medications: I have reviewed the patient's current medications. Scheduled Meds: . enoxaparin (LOVENOX) injection  40 mg Subcutaneous Q24H  . potassium chloride  40 mEq Oral BID  . sodium chloride  3 mL Intravenous Q12H   Continuous Infusions: . sodium chloride 125 mL/hr at 01/30/15 2110   PRN Meds:.acetaminophen **OR** acetaminophen, ibuprofen, oxyCODONE Assessment/Plan: Principal Problem:   Fever Active Problems:   Sepsis   Myalgia   Right shoulder pain   Pyrexia   Arterial hypotension   Pain, dental    Polyarthralgia   Elevated troponin  Fever and myalgias: Three months ago the patient was sent home after similar episode with the tentative diagnosis of viral etiology. Given the similar presentation and presence of a young child, viral etiology is again high on the differential. Due to joint involvement, septic arthritis is also on the differential as a can't miss diagnosis. Other causes include rheumatologic etiologies, including polymyositis and SLE, especially since family history is unknown.  - CBC w/ differential unremarkable, will follow daily - Blood smear normal - Blood cultures x2 sent, NGTD x 2 days - Vanc and ceftazidime d/c due to likely viral etiology and no known source of infection - IVF for rehydration - CT chest and abdomen only mild splenomegaly, otherwise unremarkable - Acetaminophen 567m q6h for fever - Ibuprofen [id number] PRN for fever or pain - Oxycodone 5-1026m4h for myalgia and back pain  - LDH normal - f/u CMV panel: negative - f/u respiratory viral panel: negative - Rheumatoid factor negative - Hepatitis panel negative - Autoimmune work up (ANCA, ANA, CCP, ESR) pending - f/u EBV panel - f/u ehrlichiosis panel - f/u borrelia Abs - f/u aldolase - F/u CCP - F/u Quantiferon gold -  F/u SPEP  Joint pain:  - CT and xray negative for effusion or lytic lesions - Consulted OT/PT  Dental Pain: Patient noted that he had impacted wisdom teeth that have been hurting lately - Jaw xray showed minor dental caries and impacted wisdom teeth of R, no signs of abcess - Exam of oral cavity unremarkable  Chest pain: The patient noted an episode of chest pain while in the ED that lasted about 5 minutes. Troponins at the time were negative, but EKG at that time showed anterolateral T wave depressions, new from EKG the day prior. The pain resolved spontaneously and hasn't reoccurred, though tropinin 8/7 was elevated to 0.13, normal EKG. Due to hypotension and tachycardia,  potentially demand ischemia.  - Followup troponin 0.10, not likely CAD  FEN/GI: - Diet: Regular - Replace electrolytes as needed (K<4, Mg<2)  Prophylaxis: - Lovenox 26m daily  Code: Full  Dispo: Home  This is a MCareers information officerNote.  The care of the patient was discussed with Dr. VZada Findersand the assessment and plan formulated with their assistance.  Please see their attached note for official documentation of the daily encounter.   LOS: 3 days   KSandria Manly Med Student 01/31/2015, 8:04 AM

## 2015-01-31 NOTE — Progress Notes (Addendum)
Subjective: Patient feels "pretty good" this morning. States he had some general muscle pain overnight which has resolved. He feels ready to go home today. Objective: Vital signs in last 24 hours: Filed Vitals:   01/30/15 0747 01/30/15 1404 01/30/15 2229 01/31/15 0544  BP: 109/70 110/75 106/63 95/58  Pulse: 66 83 57 57  Temp: 98.5 F (36.9 C) 97.5 F (36.4 C) 97.9 F (36.6 C) 98.5 F (36.9 C)  TempSrc: Axillary Axillary Oral   Resp: 28 18 17 16   Height:      Weight:      SpO2: 100% 100% 99% 97%   Weight change:   Intake/Output Summary (Last 24 hours) at 01/31/15 1106 Last data filed at 01/31/15 0752  Gross per 24 hour  Intake 2659.92 ml  Output   2050 ml  Net 609.92 ml   General: resting in bed, no acute distress HEENT: EOMI, no scleral icterus Cardiac: RRR, no rubs, murmurs or gallops Pulm: clear to auscultation bilaterally Abd: soft, nontender to palpation, nondistended Ext/musculoskeletal: warm and well perfused, no pedal edema Skin: no rashes or lesions Neuro: alert and oriented X3  Lab Results: Basic Metabolic Panel:  Recent Labs Lab 01/28/15 0906 01/29/15 0258 01/30/15 0714  NA 137 138  --   K 3.4* 3.4*  --   CL 105 106  --   CO2 22 25  --   GLUCOSE 106* 107*  --   BUN 9 5*  --   CREATININE 0.97 0.95  --   CALCIUM 8.7* 7.7*  --   MG  --   --  1.7   Liver Function Tests:  Recent Labs Lab 01/27/15 2250 01/28/15 0906  AST 48* 41  ALT 55 48  ALKPHOS 80 60  BILITOT 0.9 1.3*  PROT 7.9 6.2*  ALBUMIN 4.4 3.7   CBC:  Recent Labs Lab 01/30/15 1408 01/31/15 0631  WBC 3.0* 4.4  NEUTROABS 1.6* 2.4  HGB 14.3 13.7  HCT 40.8 38.9*  MCV 87.2 86.4  PLT 141* 147*   Cardiac Enzymes:  Recent Labs Lab 01/27/15 2250 01/28/15 0906 01/29/15 0257 01/29/15 1236 01/30/15 0714  CKTOTAL 82  --   --   --  115  TROPONINI  --  <0.03 0.13* 0.10*  --    Anemia Panel:  Recent Labs Lab 01/28/15 1520  FERRITIN 127   Urine Drug Screen: Drugs of  Abuse     Component Value Date/Time   LABOPIA NONE DETECTED 11/17/2014 0628   COCAINSCRNUR NONE DETECTED 11/17/2014 0628   LABBENZ NONE DETECTED 11/17/2014 0628   AMPHETMU POSITIVE* 11/17/2014 0628   THCU NONE DETECTED 11/17/2014 0628   LABBARB NONE DETECTED 11/17/2014 0628    Urinalysis:  Recent Labs Lab 01/28/15 0055  COLORURINE YELLOW  LABSPEC 1.017  PHURINE 6.5  GLUCOSEU NEGATIVE  HGBUR NEGATIVE  BILIRUBINUR NEGATIVE  KETONESUR NEGATIVE  PROTEINUR NEGATIVE  UROBILINOGEN 0.2  NITRITE NEGATIVE  LEUKOCYTESUR NEGATIVE    Micro Results: Recent Results (from the past 240 hour(s))  Culture, blood (routine x 2)     Status: None (Preliminary result)   Collection Time: 01/27/15 10:50 PM  Result Value Ref Range Status   Specimen Description BLOOD LEFT ARM  Final   Special Requests BOTTLES DRAWN AEROBIC AND ANAEROBIC 5 CC  Final   Culture NO GROWTH 2 DAYS  Final   Report Status PENDING  Incomplete  Culture, blood (routine x 2)     Status: None (Preliminary result)   Collection Time: 01/27/15 11:05 PM  Result Value Ref Range Status   Specimen Description BLOOD RIGHT ARM  Final   Special Requests BOTTLES DRAWN AEROBIC AND ANAEROBIC 5 CC  Final   Culture NO GROWTH 2 DAYS  Final   Report Status PENDING  Incomplete  Rapid strep screen     Status: None   Collection Time: 01/27/15 11:54 PM  Result Value Ref Range Status   Streptococcus, Group A Screen (Direct) NEGATIVE NEGATIVE Final    Comment: (NOTE) A Rapid Antigen test may result negative if the antigen level in the sample is below the detection level of this test. The FDA has not cleared this test as a stand-alone test therefore the rapid antigen negative result has reflexed to a Group A Strep culture.   Culture, Group A Strep     Status: Abnormal   Collection Time: 01/27/15 11:54 PM  Result Value Ref Range Status   Strep A Culture Comment (A)  Final    Comment: (NOTE) Beta-hemolytic colonies, not group A  Streptococcus isolated. Performed At: [facility] [place], [place] [id number] Lindon Romp MD MA:[id number]   Urine culture     Status: None   Collection Time: 01/28/15 12:55 AM  Result Value Ref Range Status   Specimen Description URINE, CLEAN CATCH  Final   Special Requests NONE  Final   Culture NO GROWTH 1 DAY  Final   Report Status 01/29/2015 FINAL  Final  Blood culture (routine x 2)     Status: None (Preliminary result)   Collection Time: 01/28/15  9:09 AM  Result Value Ref Range Status   Specimen Description BLOOD LEFT FOREARM  Final   Special Requests BOTTLES DRAWN AEROBIC AND ANAEROBIC 5ML  Final   Culture NO GROWTH 2 DAYS  Final   Report Status PENDING  Incomplete  Blood culture (routine x 2)     Status: None (Preliminary result)   Collection Time: 01/28/15  9:09 AM  Result Value Ref Range Status   Specimen Description BLOOD LEFT FOREARM  Final   Special Requests BOTTLES DRAWN AEROBIC AND ANAEROBIC 5CC  Final   Culture NO GROWTH 2 DAYS  Final   Report Status PENDING  Incomplete  Respiratory virus panel     Status: None   Collection Time: 01/28/15 11:36 AM  Result Value Ref Range Status   Source - RVPAN NASOPHARYNGEAL  Corrected   Respiratory Syncytial Virus A Negative Negative Final   Respiratory Syncytial Virus B Negative Negative Final   Influenza A Negative Negative Final   Influenza B Negative Negative Final   Parainfluenza 1 Negative Negative Final   Parainfluenza 2 Negative Negative Final   Parainfluenza 3 Negative Negative Final   Metapneumovirus Negative Negative Final   Rhinovirus Negative Negative Final   Adenovirus Negative Negative Final    Comment: (NOTE) Performed At: [facility] [place], [place] [id number] Lindon Romp MD KA:[id number]   MRSA PCR Screening     Status: None   Collection Time: 01/28/15  1:52 PM  Result Value Ref Range Status   MRSA by PCR NEGATIVE NEGATIVE Final    Comment:         The GeneXpert MRSA Assay (FDA approved for NASAL specimens only), is one component of a comprehensive MRSA colonization surveillance program. It is not intended to diagnose MRSA infection nor to guide or monitor treatment for MRSA infections.    Studies/Results: No results found. Medications: I have reviewed the patient's current medications. Scheduled Meds: .  enoxaparin (LOVENOX) injection  40 mg Subcutaneous Q24H  . potassium chloride  40 mEq Oral BID  . sodium chloride  3 mL Intravenous Q12H   Continuous Infusions: . sodium chloride 125 mL/hr at 01/30/15 2110   PRN Meds:.acetaminophen **OR** acetaminophen, ibuprofen, oxyCODONE Assessment/Plan: Principal Problem:   Fever Active Problems:   Sepsis   Myalgia   Right shoulder pain   Pyrexia   Arterial hypotension   Pain, dental   Polyarthralgia   Elevated troponin  Fever of unknown oirgin: Patient had similar more severe episode in May 2016 with no definitive diagnosis after extensive workup, finally referred to as fever of unknown origin. Imaging in ED mostly negative except for splenomegaly similar to prior exam as well as mild scarring in left lower lobe.  No obvious sign of bacterial infection, more likely to be inflammatory or viral cause, with suspicion for malignant process.   Patient's quick improvement in CBC counts are less likely to indicate a lymphoma or other malignant process. Will pursue possible tick-born etiologies, otherwise no other obvious   Review of repeat smear by Dr. Beryle Beams is essentially normal. Bone marrow biopsy will not be considered at this time as it is likely low yield and his CBC counts have normalized.  Patient is feeling improved and feels ready to go home. States his arthralgias have resolved and only has minimal myalgia at this time. Will discharge today and schedule follow up appointment in [facility] clinic with Dr. Beryle Beams in 2 weeks. He also has schedule appointment with new  PCP at [facility] in 2 weeks.  -Ehrlichia antibody panel -B. burgdorfi antibodies -SPEP -TB quantiferon -Blood Cultures x2 obtained in ED prior to abx: no growth -F/U Respiratory virus panel (results negative) -LDH (WNL) -f/u EBV panel -f/u CMV panel (results: IgM negative) -f/u Hepatitis panel (results: negative)  Myalgia: Patient's generalized muscle pains improved this morning. Non tender to palpation of muscles. ESR, CRP, Ferritin come back unremarkable. We are considering this to be either an inflammatory or viral cause, will continue supportive care and follow up on lab results. -Aldolase -f/u ANA -f/u CCP -f/u RF (result: negative) -f/u ANCA   Dispo:  Anticipated discharge today.   The patient does not have a current PCP (No primary care provider on file.) and does need an [facility] hospital follow-up appointment after discharge.  The patient does not have transportation limitations that hinder transportation to clinic appointments.  .Services Needed at time of discharge: Y = Yes, Blank = No PT:   OT:   RN:   Equipment:   Other:     LOS: 3 days   Zada Finders, MD 01/31/2015, 11:06 AM

## 2015-01-31 NOTE — Progress Notes (Signed)
Hector Williamson to be D/C'd Home per MD order.  Discussed with the patient and all questions fully answered.  VSS, Skin clean, dry and intact without evidence of skin break down, no evidence of skin tears noted. IV catheter discontinued intact. Site without signs and symptoms of complications. Dressing and pressure applied.  An After Visit Summary was printed and given to the patient. Patient received prescription.  D/c education completed with patient/family including follow up instructions, medication list, d/c activities limitations if indicated, with other d/c instructions as indicated by MD - patient able to verbalize understanding, all questions fully answered.   Patient instructed to return to ED, call 911, or call MD for any changes in condition.   Patient escorted via WC, and D/C home via private auto.  [name] 01/31/2015 2:30

## 2015-01-31 NOTE — Discharge Instructions (Signed)
We are discharging you to home today. Please continue with your appointment with your family physician at [facility].   We have scheduled you for a follow up appointment at our Internal Medicine Clinic with Dr. Cyndie Chime on 02/13/2015 at 3pm. You may call the clinic at [phone] if needed to reschedule.

## 2015-01-31 NOTE — Discharge Summary (Signed)
Name: Hector Williamson MRN: [id number] DOB: Dec 12, 1975 39 y.o. PCP: No primary care provider on file.  Date of Admission: 01/28/2015  8:45 AM Date of Discharge: 01/31/2015 Attending Physician: Annia Belt, MD  Discharge Diagnosis: 1. Fever of unknown origin (FUO) Principal Problem:   Fever of unknown origin (FUO) Active Problems:   Myalgia   Right shoulder pain   Pyrexia   Arterial hypotension   Pain, dental   Polyarthralgia   Elevated troponin  Discharge Medications:   Medication List    TAKE these medications        acetaminophen 500 MG tablet  Commonly known as:  TYLENOL  Take 1 tablet (500 mg total) by mouth every 6 (six) hours as needed for mild pain (or Fever >/= 101).     FISH OIL PO  Take 1 capsule by mouth daily.     Ginkgo Biloba 40 MG Tabs  Take 1 tablet by mouth daily.     HYDROmorphone 2 MG tablet  Commonly known as:  DILAUDID  Take 1 tablet (2 mg total) by mouth every 4 (four) hours as needed for severe pain.     ibuprofen 400 MG tablet  Commonly known as:  ADVIL,MOTRIN  Take 1 tablet (400 mg total) by mouth every 6 (six) hours as needed for fever or mild pain.     lisdexamfetamine 40 MG capsule  Commonly known as:  VYVANSE  Take 40 mg by mouth every morning.     oxyCODONE 5 MG immediate release tablet  Commonly known as:  Oxy IR/ROXICODONE  Take 1 tablet (5 mg total) by mouth every 6 (six) hours as needed for moderate pain or severe pain.        Disposition and follow-up:   Hector Williamson was discharged from [facility] in Good condition.  At the hospital follow up visit please address:  1.  Fever of unknown origin: Patient has returned to ED for second time in 3 months with unexplained fever after extensive workup. Will need to follow up on pending labs as well as for recurrence of symptoms. Evaluation for possible etiologies of his symptomatic episodes should be pursued further. Please also follow up on his  symptoms/pain. Patient is given 2 weeks worth of oxycodone for pain management.  2.  Labs / imaging needed at time of follow-up: none  3.  Pending labs/ test needing follow-up: CCP, ANA, EBV, Aldolase, Ehrlichia, borrelia, SPEP, TB quantiferon gold.  Follow-up Appointments: Follow-up Information    Follow up with Annia Belt, MD. Schedule an appointment as soon as possible for a visit in 2 weeks.   Specialty:  Oncology   Contact information:   [address] [place] [address] [phone]       Discharge Instructions: Discharge Instructions    Call MD for:  extreme fatigue    Complete by:  As directed      Call MD for:  redness, tenderness, or signs of infection (pain, swelling, redness, odor or green/yellow discharge around incision site)    Complete by:  As directed      Call MD for:  severe uncontrolled pain    Complete by:  As directed      Diet - low sodium heart healthy    Complete by:  As directed      Increase activity slowly    Complete by:  As directed            Consultations:  n/a  Procedures Performed:  Dg Orthopantogram  01/28/2015   CLINICAL DATA:  Chronic right upper jaw pain.  EXAM: ORTHOPANTOGRAM/PANORAMIC  COMPARISON:  None.  FINDINGS: Teeth 1, 17 and 32 have been extracted. Retained tooth 16 appears impacted or barely erupted. There may be a very subtle caries involving tooth 2. No periapical lucencies identified. Other teeth appear unremarkable with evidence of prior dental hardware and several root canals. Or mandible and maxilla are unremarkable. No bony or dental mass lesions or cysts identified.  IMPRESSION: Remaining wisdom molar, tooth 16, appears impacted or barely erupted. There may be subtle dental caries involving tooth 2.   Electronically Signed   By: Aletta Edouard M.D.   On: 01/28/2015 13:23   Dg Chest 2 View  01/28/2015   CLINICAL DATA:  Fever and right-sided chest pain  EXAM: CHEST  2 VIEW  COMPARISON:  11/16/2014  FINDINGS:  There is minimal linear scarring or atelectasis in the lateral left base. The lungs are otherwise clear. There is no pleural effusion. Pulmonary vasculature is normal. Hilar and mediastinal contours are unremarkable and unchanged.  IMPRESSION: Minimal linear scarring or atelectasis in the lateral left base.   Electronically Signed   By: Andreas Newport M.D.   On: 01/28/2015 00:57   Dg Shoulder Right  01/28/2015   CLINICAL DATA:  39 year old male with history of right-sided shoulder pain yesterday evening, with some associated chills, nausea and fever.  EXAM: RIGHT SHOULDER - 2+ VIEW  COMPARISON:  No priors.  FINDINGS: There is no evidence of fracture or dislocation. There is no evidence of arthropathy or other focal bone abnormality. Soft tissues are unremarkable.  IMPRESSION: Negative.   Electronically Signed   By: Vinnie Langton M.D.   On: 01/28/2015 13:17   Ct Chest W Contrast  01/28/2015   CLINICAL DATA:  39 year old male with fever, body aches, nausea and diarrhea.  EXAM: CT CHEST, ABDOMEN, AND PELVIS WITH CONTRAST  TECHNIQUE: Multidetector CT imaging of the chest, abdomen and pelvis was performed following the standard protocol during bolus administration of intravenous contrast.  CONTRAST:  133m OMNIPAQUE IOHEXOL 300 MG/ML  SOLN  COMPARISON:  CT of the chest, CT of the chest 11/18/2014. CT of the abdomen and pelvis 11/17/2014.  FINDINGS: CT CHEST FINDINGS  Mediastinum/Lymph Nodes: Heart size is normal. There is no significant pericardial fluid, thickening or pericardial calcification. No pathologically enlarged mediastinal or hilar lymph nodes. Esophagus is unremarkable in appearance. No axillary lymphadenopathy.  Lungs/Pleura: Linear opacities in the left lung base favored to represent areas of scarring. No acute consolidative airspace disease. No pleural effusions. No suspicious appearing pulmonary nodules or masses.  Musculoskeletal/Soft Tissues: There are no aggressive appearing lytic or blastic  lesions noted in the visualized portions of the skeleton.  CT ABDOMEN AND PELVIS FINDINGS  Hepatobiliary: Diffuse low attenuation throughout the hepatic parenchyma, compatible with hepatic steatosis. No cystic or solid hepatic lesions. No intra or extrahepatic biliary ductal dilatation. Gallbladder is normal in appearance.  Pancreas: No pancreatic mass. No pancreatic ductal dilatation. No pancreatic or peripancreatic fluid or inflammatory changes.  Spleen: Spleen remains enlarged measuring 15.5 x 4.9 x 12.2 cm (estimated splenic volume of 463 mL).  Adrenals/Urinary Tract: Bilateral adrenal glands and bilateral kidneys are normal in appearance. No hydroureteronephrosis or perinephric stranding to indicate urinary tract obstruction at this time. Urinary bladder is normal in appearance.  Stomach/Bowel: Normal appearance of the stomach. No pathologic dilatation of small bowel or colon. Normal appendix.  Vascular/Lymphatic: No significant atherosclerotic  disease, aneurysm or dissection identified in the abdominal or pelvic vasculature. No lymphadenopathy noted in the abdomen or pelvis.  Reproductive: Prostate gland and seminal vesicles are normal in appearance.  Other: No significant volume of ascites.  No pneumoperitoneum.  Musculoskeletal: There are no aggressive appearing lytic or blastic lesions noted in the visualized portions of the skeleton.  IMPRESSION: 1. No acute findings in the chest, abdomen or pelvis to account for the patient's symptoms. 2. Splenomegaly again noted, similar to the prior examination. 3. Mild scarring in the left lower lobe.   Electronically Signed   By: Vinnie Langton M.D.   On: 01/28/2015 10:26   Ct Abdomen Pelvis W Contrast  01/28/2015   CLINICAL DATA:  39 year old male with fever, body aches, nausea and diarrhea.  EXAM: CT CHEST, ABDOMEN, AND PELVIS WITH CONTRAST  TECHNIQUE: Multidetector CT imaging of the chest, abdomen and pelvis was performed following the standard protocol during  bolus administration of intravenous contrast.  CONTRAST:  174m OMNIPAQUE IOHEXOL 300 MG/ML  SOLN  COMPARISON:  CT of the chest, CT of the chest 11/18/2014. CT of the abdomen and pelvis 11/17/2014.  FINDINGS: CT CHEST FINDINGS  Mediastinum/Lymph Nodes: Heart size is normal. There is no significant pericardial fluid, thickening or pericardial calcification. No pathologically enlarged mediastinal or hilar lymph nodes. Esophagus is unremarkable in appearance. No axillary lymphadenopathy.  Lungs/Pleura: Linear opacities in the left lung base favored to represent areas of scarring. No acute consolidative airspace disease. No pleural effusions. No suspicious appearing pulmonary nodules or masses.  Musculoskeletal/Soft Tissues: There are no aggressive appearing lytic or blastic lesions noted in the visualized portions of the skeleton.  CT ABDOMEN AND PELVIS FINDINGS  Hepatobiliary: Diffuse low attenuation throughout the hepatic parenchyma, compatible with hepatic steatosis. No cystic or solid hepatic lesions. No intra or extrahepatic biliary ductal dilatation. Gallbladder is normal in appearance.  Pancreas: No pancreatic mass. No pancreatic ductal dilatation. No pancreatic or peripancreatic fluid or inflammatory changes.  Spleen: Spleen remains enlarged measuring 15.5 x 4.9 x 12.2 cm (estimated splenic volume of 463 mL).  Adrenals/Urinary Tract: Bilateral adrenal glands and bilateral kidneys are normal in appearance. No hydroureteronephrosis or perinephric stranding to indicate urinary tract obstruction at this time. Urinary bladder is normal in appearance.  Stomach/Bowel: Normal appearance of the stomach. No pathologic dilatation of small bowel or colon. Normal appendix.  Vascular/Lymphatic: No significant atherosclerotic disease, aneurysm or dissection identified in the abdominal or pelvic vasculature. No lymphadenopathy noted in the abdomen or pelvis.  Reproductive: Prostate gland and seminal vesicles are normal in  appearance.  Other: No significant volume of ascites.  No pneumoperitoneum.  Musculoskeletal: There are no aggressive appearing lytic or blastic lesions noted in the visualized portions of the skeleton.  IMPRESSION: 1. No acute findings in the chest, abdomen or pelvis to account for the patient's symptoms. 2. Splenomegaly again noted, similar to the prior examination. 3. Mild scarring in the left lower lobe.   Electronically Signed   By: DVinnie LangtonM.D.   On: 01/28/2015 10:26    2D Echo: n/a  Cardiac Cath: n/a  Admission HPI: Mr. DEvyn Putzieris a 39year old gentleman with PMH of back pain and ADHD who presents to the ED with complaints of fever and generalized muscle and joint pain. He presented to the ED yesterday with similar symptoms and workup showed no acute pathology. He was sent home after management of IV fluids with advise to use tylenol for fever and pain. He returned today  with continued symptoms, now feeling worse and with shortness of breath. He states that these symptoms started about 24 hours ago. His muscle pain is described as a dull achy pain and his joint pain is stated to be in his right should, hip, and ankle. No left-sided joint pain. He reports having non-bloody diarrhea several days ago which is now resolved. He has tried Ibuprofen and Tylenol without relief.  He has additional complaints over the last day of night sweats, chills, lightheadedness, right-sided tooth pain, and headache. Denies recent travel, no sick contacts, sexually active only with wife. He has two dogs, fully vaccinated, denies any bites. Denies any insect/tick bites. Patient was on Vyvanse for ADHD, which he stopped on Monday, 01/23/2015. He initially thought this was withdrawal, but does not believe that is the case anymore. No other recent changes in medication. Denies any recent changes in diet. Former smoker quit 8 years ago, occasional alcohol, denies illicit drug use. Patient adopted, family history  unknown.  Patient did have episode of retrosternal dull chest ache in ED which resolved after five minutes.  Patient was admitted to hospital for similar but more severe episode in May 2016. He spent four days in hospital, had complete workup for suspected sepsis/meningitis without definitive diagnosis. Was on vancomycin in hospital discharged on empiric doxycycline. He states he was at usual health after discharge. He denies any other similar episodes.  Vitals on admission: BP 90/47, Pulse 107, Temp 101.1 F (rectal), RR 15, SpO2 92%.  Hospital Course by problem list: Principal Problem:   Fever of unknown origin (FUO) Active Problems:   Myalgia   Right shoulder pain   Pyrexia   Arterial hypotension   Pain, dental   Polyarthralgia   Elevated troponin   1. Fever: The patient was febrile at 38.4 C on admission, which was controlled with acetaminophen. One dose of vancomycin and ceftazidime was given in the ED but was discontinued due to no source of infection and suspicion for viral etiology. Patient endorsed significant right molar pain which was tender to touch. Orthopantogram was unremarkable. Blood cultures were sent, which eventually came back with no growth. CTs of the chest and abd/pelvis were done which showed mild splenomegaly at 13 cm, otherwise unremarkable. Of note, the patient's white blood cell count was normal at 5.14 K/uL, hemoglobin was 13.4 g/dL, and platelets were 160 K/uL. All cells lines dipped the following day 8/7, though the patient had also received IV fluids. The patient again became febrile overnight on 8/7, controlled with acetaminophen and ibuprofen. Blood smear was unremarkable. The following tests were sent and came back negative or normal: urinalysis, lactate, mononucleosis screen, respiratory virus panel, MRSA screen, ANCA, Rheumatoid factor, ferritin, CRP, ESR, CMV IgM and IgG, CK, LDH, Hepatitis panel. Upon discharge, the following tests were still pending: CCP,  ANA, EBV, Ehrlichia, borrelia, SPEP, TB quantiferon gold.   Of note, during his similar episode in May of this year, he was noted to have negative influenza, RMSF, and HIV tests. He has planned followup with his PCP and will continue to see Dr. Beryle Beams to review the pending test results.  2. Myalgias/Arthralgias: Upon admission the patient complained of right sided arthralgias in his shoulder, hip, and ankle as well as diffuse myalgias over his entire body. He endorsed significant right shoulder pain on initial exam, which follow up x-ray was unremarkable. The patient was noted to be borderline hypokalemic at 3.4 mmol/L, lab values otherwise unremarkable. He received pain medications for the myalgias  and arthralgias, though they worsened over the next couple days. On the morning of 8/8, the patient was found to be in extreme pain, localizing to the IV site where he was getting IV potassium. After discontinuation of the IV potassium, the patient improved and went back to baseline myalgia that steadily improved until discharge on 8/9.   3. Elevated troponin: The patient had one episode of chest pain that lasted about five minutes in the ED. The patient was given ASA 325m, and an EKG was performed that showed ST depressions in anterolateral leads V3, V4, and V5, new from EKG the day prior. Troponin at the time was  negative but were trended, and the following night on 8/7 it was found to be elevated to 0.13. The patient was asymptomatic at that time and EKG was unremarkable, showing resolution of ischemic changes. One final troponin was collected ten hours later that was 0.10, patient again asymptomatic. EKG was again unremarkable.  Discharge Vitals:   BP 95/58 mmHg  Pulse 57  Temp(Src) 98.5 F (36.9 C) (Oral)  Resp 16  Ht 6' (1.829 m)  Wt 192 lb (87.091 kg)  BMI 26.03 kg/m2  SpO2 97%  Discharge Labs:  Results for orders placed or performed during the hospital encounter of 01/28/15 (from the  past 24 hour(s))  CBC with Differential/Platelet     Status: Abnormal   Collection Time: 01/31/15  6:31 AM  Result Value Ref Range   WBC 4.4 4.0 - 10.5 K/uL   RBC 4.50 4.22 - 5.81 MIL/uL   Hemoglobin 13.7 13.0 - 17.0 g/dL   HCT 38.9 (L) 39.0 - 52.0 %   MCV 86.4 78.0 - 100.0 fL   MCH 30.4 26.0 - 34.0 pg   MCHC 35.2 30.0 - 36.0 g/dL   RDW 13.5 11.5 - 15.5 %   Platelets 147 (L) 150 - 400 K/uL   Neutrophils Relative % 53 43 - 77 %   Neutro Abs 2.4 1.7 - 7.7 K/uL   Lymphocytes Relative 34 12 - 46 %   Lymphs Abs 1.5 0.7 - 4.0 K/uL   Monocytes Relative 7 3 - 12 %   Monocytes Absolute 0.3 0.1 - 1.0 K/uL   Eosinophils Relative 5 0 - 5 %   Eosinophils Absolute 0.2 0.0 - 0.7 K/uL   Basophils Relative 1 0 - 1 %   Basophils Absolute 0.0 0.0 - 0.1 K/uL    Signed: VZada Finders MD 01/31/2015, 2:56 PM    Services Ordered on Discharge: none Equipment Ordered on Discharge: none

## 2015-02-01 LAB — PROTEIN ELECTROPHORESIS, SERUM
A/G Ratio: 1.1 (ref 0.7–1.7)
Albumin ELP: 3.6 g/dL (ref 2.9–4.4)
Alpha-1-Globulin: 0.3 g/dL (ref 0.0–0.4)
Alpha-2-Globulin: 0.7 g/dL (ref 0.4–1.0)
Beta Globulin: 1 g/dL (ref 0.7–1.3)
Gamma Globulin: 1.2 g/dL (ref 0.4–1.8)
Globulin, Total: 3.2 g/dL (ref 2.2–3.9)
TOTAL PROTEIN ELP: 6.8 g/dL (ref 6.0–8.5)

## 2015-02-01 LAB — ALDOLASE: Aldolase: 8.3 U/L (ref 3.3–10.3)

## 2015-02-02 LAB — QUANTIFERON IN TUBE
QFT TB AG MINUS NIL VALUE: 0 IU/mL
QUANTIFERON TB AG VALUE: 0.04 IU/mL
QUANTIFERON TB GOLD: NEGATIVE
Quantiferon Nil Value: 0.04 IU/mL

## 2015-02-02 LAB — CULTURE, BLOOD (ROUTINE X 2)
CULTURE: NO GROWTH
Culture: NO GROWTH
Culture: NO GROWTH
Culture: NO GROWTH

## 2015-02-02 LAB — EHRLICHIA ANTIBODY PANEL
E CHAFFEENSIS AB, IGG: NEGATIVE
E CHAFFEENSIS AB, IGM: NEGATIVE
E. CHAFFEENSIS (HME) IGM TITER: NEGATIVE
E.Chaffeensis (HME) IgG: NEGATIVE

## 2015-02-02 LAB — QUANTIFERON TB GOLD ASSAY (BLOOD)

## 2015-02-03 LAB — B. BURGDORFI ANTIBODIES: B BURGDORFERI AB IGG+ IGM: 0.93 {ISR} — AB (ref 0.00–0.90)

## 2015-02-03 LAB — LYME, WESTERN BLOT, SERUM (REFLEXED)
IGG P30 AB.: ABSENT
IGG P39 AB.: ABSENT
IGG P45 AB.: ABSENT
IGM P23 AB.: ABSENT
IgG P23 Ab.: ABSENT
IgG P28 Ab.: ABSENT
IgG P58 Ab.: ABSENT
IgG P66 Ab.: ABSENT
IgG P93 Ab.: ABSENT
IgM P39 Ab.: ABSENT
IgM P41 Ab.: ABSENT
LYME IGM WB: NEGATIVE
Lyme IgG Wb: NEGATIVE

## 2015-02-13 ENCOUNTER — Encounter: Payer: Self-pay | Admitting: Oncology

## 2015-02-13 ENCOUNTER — Ambulatory Visit (INDEPENDENT_AMBULATORY_CARE_PROVIDER_SITE_OTHER): Payer: BLUE CROSS/BLUE SHIELD | Admitting: Oncology

## 2015-02-13 VITALS — BP 114/75 | HR 70 | Temp 98.1°F | Ht 72.0 in | Wt 194.6 lb

## 2015-02-13 DIAGNOSIS — M791 Myalgia: Secondary | ICD-10-CM

## 2015-02-13 DIAGNOSIS — R760 Raised antibody titer: Secondary | ICD-10-CM | POA: Diagnosis not present

## 2015-02-13 DIAGNOSIS — R161 Splenomegaly, not elsewhere classified: Secondary | ICD-10-CM

## 2015-02-13 DIAGNOSIS — R509 Fever, unspecified: Secondary | ICD-10-CM

## 2015-02-13 NOTE — Patient Instructions (Signed)
Return for lab on 9/12 MD visit 2 weeks after lab done

## 2015-02-13 NOTE — Progress Notes (Signed)
Patient ID: Hector Williamson, male   DOB: 1976-02-04, 39 y.o.   MRN: [id number] New Patient Hematology   Hector Williamson [id number] [birth year] 39 y.o. 02/13/2015  CC:   HPI:  First post hospital visit for this 39 year old man to review outstanding laboratory tests along with his wife. He was perfectly healthy with no major medical or surgical illness until he presented with fever, myalgias, and arthralgias with initial hospital admission on 11/17/2014 with a second admission for the same signs and symptoms on 01/28/2015. Please see history, physical, and discharge summary for complete details. Other than fever, only physical finding was diffuse muscle tenderness more prominent in the shoulder girdle muscles. No heart murmur. He had no lymphadenopathy. No palpable splenomegaly although spleen borderline enlarged at 13 cm on initial admission with no change at time of second admission. He had an extensive evaluation to rule out potential infectious etiologies including an initial lumbar puncture. All bacterial cultures were negative. A CT scan of the chest abdomen and pelvis, other than showing a prominent spleen size with no focal lesions, showed no pulmonary parenchymal mass, no mediastinal, hilar, abdominal, retroperitoneal, or pelvic lymphadenopathy and no focal liver lesions. His symptoms completely resolved in between hospitalizations and were less severe at time of second presentation. He was treated with observation and no antibiotics. He had a single high fever on the second admission which resolved spontaneously. Once again he had diffuse myalgias which slowly subsided. Muscle enzymes, both CK and aldolase, not elevated. A single troponin borderline elevated and not reproducible. He had some minor liver function abnormalities on August 6 with a bilirubin of 1.3, total protein 6.2 with albumin 3.7. Normal alkaline phosphatase and normal transaminases. Serum protein electrophoresis normal and no  monoclonal spike observed. He had a mild elevation of his white count that was not persistent. On May 25, white count 12,200, peak value 13,800 the next day,  fall to 8.600 the following day. Differential with 79% neutrophils, 13 lymphocytes, 8 monocytes, no eosinophils. Hemoglobin 14.2, hematocrit 38.7, MCV 85. At time of the second admission, on  August 5 white count 5600, hemoglobin 15, platelets 183,000. Subsequent fall in the white blood count as low as 3000 on August 8 with 52% neutrophils, 37% lymphocytes, 7 monocytes, 3 eosinophils, 1 basophil. Fall in platelets as low as 132,000 on August 7 with improvement up to 147,000 with white count 4400 by discharge August 9.  A number of bacterial and viral studies were obtained. During the initial admission, Monospot was negative, serology negative for [facility] spotted fever. Results now available from serology from the August 5 admission: Negative for Borrelia, Ehrlichia, Lyme. High positive IgG but not IgM antibodies against CMV High positive IgG antibodies against EBV viral Antigen, Nuclear Antigen, and Early Antigen Suggesting a Recent or Reactivation Infection with this virus.  In the interim since discharge, symptoms have again completely resolved with no further fevers, myalgias, arthralgias, or other constitutional symptoms.    PMH: Past Medical History  Diagnosis Date  . Back pain     spasms  . ADHD (attention deficit hyperactivity disorder)     No past surgical history on file.  Allergies: Allergies  Allergen Reactions  . Penicillins Other (See Comments)    High fever    Medications: Current outpatient prescriptions:  .  acetaminophen (TYLENOL) 500 MG tablet, Take 1 tablet (500 mg total) by mouth every 6 (six) hours as needed for mild pain (or Fever >/= 101)., Disp: 30 tablet, Rfl: 0 .  Ginkgo Biloba 40 MG TABS, Take 1 tablet by mouth daily., Disp: , Rfl:  .  ibuprofen (ADVIL,MOTRIN) 400 MG tablet, Take 1 tablet (400  mg total) by mouth every 6 (six) hours as needed for fever or mild pain., Disp: 30 tablet, Rfl: 0 .  lisdexamfetamine (VYVANSE) 40 MG capsule, Take 40 mg by mouth every morning., Disp: , Rfl:  .  Omega-3 Fatty Acids (FISH OIL PO), Take 1 capsule by mouth daily., Disp: , Rfl:    Social History: Married. One young child. He works for a company called [employer] in [place].  He quit smoking about 15 years ago. His smoking use included Cigarettes. He quit smokeless tobacco use about 17 years ago. He reports that he drinks alcohol. He reports that he does not currently use illicit drugs. (remote history of cocaine & marijuana use while in the service 2007)  Family History: No family history on file.  Review of Systems: See history of present illness Remaining ROS negative.  Physical Exam: Blood pressure 114/75, pulse 70, temperature 98.1 F (36.7 C), temperature source Oral, height 6' (1.829 m), weight 194 lb 9.6 oz (88.27 kg), SpO2 98 %. Wt Readings from Last 3 Encounters:  02/13/15 194 lb 9.6 oz (88.27 kg)  01/28/15 192 lb (87.091 kg)  11/19/14 189 lb 6.4 oz (85.911 kg)    Patient not examined today. See recent inpatient H&P and discharge summary.    Lab Results: Lab Results  Component Value Date   WBC 4.4 01/31/2015   HGB 13.7 01/31/2015   HCT 38.9* 01/31/2015   MCV 86.4 01/31/2015   PLT 147* 01/31/2015     Chemistry      Component Value Date/Time   NA 138 01/29/2015 0258   K 3.4* 01/29/2015 0258   CL 106 01/29/2015 0258   CO2 25 01/29/2015 0258   BUN 5* 01/29/2015 0258   CREATININE 0.95 01/29/2015 0258      Component Value Date/Time   CALCIUM 7.7* 01/29/2015 0258   ALKPHOS 60 01/28/2015 0906   AST 41 01/28/2015 0906   ALT 48 01/28/2015 0906   BILITOT 1.3* 01/28/2015 0906       Review of peripheral blood film: Nonspecific findings when I reviewed his peripheral blood film in the hospital. Normochromic normocytic red cells. No spherocytes,  schistocytes, or polychromasia. No inclusions. Neutrophils normal in lobation and granulation. No toxic granulations. Majority of lymphocytes and mature. There are occasional plasmacytoid lymphocytes and benign reactive lymphocytes of the large granular type. No lymphocytes with hair-like projections or coffee bean nuclei to suspect hairy cell leukemia. Occasional eosinophil noted. No early myeloid forms.  Radiological Studies: See history of present illness    Impression and Plan: Relapsing febrile illness with prominent myalgias and borderline splenomegaly without lymphadenopathy  Serology for Epstein-Barr virus with very high IgG titers against the viral capsid antigen, nuclear antigen, and, in particular, early antigen, strongly suggests that he was actively infected with this virus. The early antigen should extinguish as the infectious process evolves. I'm going to wait for another 2 weeks and then repeat the early antigen. Outstanding hospital tests reviewed in detail with the patient and his wife and I gave him written notes as well as hard copies of the EBV results and showed them both in his CT scans.  Time spent with direct face-to-face encounter with patient and wife approximately 30 minutes.      Levert Feinstein, MD 02/13/2015, 4:32 PM

## 2015-03-06 ENCOUNTER — Other Ambulatory Visit (INDEPENDENT_AMBULATORY_CARE_PROVIDER_SITE_OTHER): Payer: BLUE CROSS/BLUE SHIELD

## 2015-03-06 DIAGNOSIS — R509 Fever, unspecified: Secondary | ICD-10-CM | POA: Diagnosis not present

## 2015-03-07 LAB — CBC WITH DIFFERENTIAL/PLATELET
BASOS: 1 %
Basophils Absolute: 0.1 10*3/uL (ref 0.0–0.2)
EOS (ABSOLUTE): 0.2 10*3/uL (ref 0.0–0.4)
EOS: 5 %
Hematocrit: 40.6 % (ref 37.5–51.0)
Hemoglobin: 13.8 g/dL (ref 12.6–17.7)
IMMATURE GRANS (ABS): 0 10*3/uL (ref 0.0–0.1)
Immature Granulocytes: 0 %
Lymphocytes Absolute: 1.9 10*3/uL (ref 0.7–3.1)
Lymphs: 40 %
MCH: 30.7 pg (ref 26.6–33.0)
MCHC: 34 g/dL (ref 31.5–35.7)
MCV: 90 fL (ref 79–97)
MONOCYTES: 9 %
MONOS ABS: 0.4 10*3/uL (ref 0.1–0.9)
Neutrophils Absolute: 2.1 10*3/uL (ref 1.4–7.0)
Neutrophils: 45 %
Platelets: 220 10*3/uL (ref 150–379)
RBC: 4.49 x10E6/uL (ref 4.14–5.80)
RDW: 14.1 % (ref 12.3–15.4)
WBC: 4.8 10*3/uL (ref 3.4–10.8)

## 2015-03-09 LAB — EBV EARLY ANTIGEN AB PROF, QN
Epstein-Barr Anti R&D: NEGATIVE
Epstein-Barr Anti-Diffuse: 1:80 {titer} — ABNORMAL HIGH

## 2015-03-20 ENCOUNTER — Encounter: Payer: BLUE CROSS/BLUE SHIELD | Admitting: Oncology

## 2015-04-24 ENCOUNTER — Encounter: Payer: BLUE CROSS/BLUE SHIELD | Admitting: Oncology

## 2016-03-31 ENCOUNTER — Ambulatory Visit (HOSPITAL_COMMUNITY)
Admission: EM | Admit: 2016-03-31 | Discharge: 2016-03-31 | Disposition: A | Discharge status: Home / Self Care | Payer: BLUE CROSS/BLUE SHIELD | Attending: Internal Medicine | Admitting: Internal Medicine

## 2016-03-31 ENCOUNTER — Encounter (HOSPITAL_COMMUNITY): Payer: Self-pay | Admitting: Family Medicine

## 2016-03-31 DIAGNOSIS — K529 Noninfective gastroenteritis and colitis, unspecified: Secondary | ICD-10-CM

## 2016-03-31 MED ORDER — KETOROLAC TROMETHAMINE 30 MG/ML IJ SOLN
30.0000 mg | Freq: Once | INTRAMUSCULAR | Status: AC
  Administered 2016-03-31: 30 mg via INTRAVENOUS

## 2016-03-31 MED ORDER — ONDANSETRON HCL 4 MG/2ML IJ SOLN
4.0000 mg | Freq: Once | INTRAMUSCULAR | Status: AC
  Administered 2016-03-31: 4 mg via INTRAMUSCULAR

## 2016-03-31 MED ORDER — SODIUM CHLORIDE 0.9 % IV BOLUS (SEPSIS)
1000.0000 mL | Freq: Once | INTRAVENOUS | Status: AC
  Administered 2016-03-31: 1000 mL via INTRAVENOUS

## 2016-03-31 MED ORDER — KETOROLAC TROMETHAMINE 30 MG/ML IJ SOLN
INTRAMUSCULAR | Status: AC
  Filled 2016-03-31: qty 1

## 2016-03-31 MED ORDER — ONDANSETRON HCL 4 MG PO TABS: 8.0000 mg | ORAL_TABLET | ORAL | 0 refills | Status: DC | PRN

## 2016-03-31 MED ORDER — ONDANSETRON HCL 4 MG/2ML IJ SOLN
INTRAMUSCULAR | Status: AC
  Filled 2016-03-31: qty 2

## 2016-03-31 NOTE — Discharge Instructions (Addendum)
Sip fluids.  Anticipate improvement in nausea/vomiting/diarrhea over the next 12-24 hours.  Recheck for worsening GI symptoms, severe/unrelenting abdominal pain, new fever >100.5.  Note for work given, return to work 04/02/16.

## 2016-03-31 NOTE — ED Provider Notes (Signed)
[facility]    CSN: [id number] Arrival date & time: 03/31/16  1248     History   Chief Complaint Chief Complaint  Patient presents with  . Generalized Body Aches  . Fever  . Nausea  . Emesis    HPI Hector Williamson is a 40 y.o. male. He presents today with the onset of vomiting, multiple episodes, early this morning. Is not currently nauseous, but gets nauseous if he is up moving around, also lightheaded. Also having some diarrhea, and diffuse abdominal discomfort. Achy all over. Family just recovered from a GI illness, with vomiting. Patient was in the intensive care unit 2 last summer, but not intubated. He had a febrile illness with some respiratory distress, characterized as chronic mono.  HPI  Past Medical History:  Diagnosis Date  . ADHD (attention deficit hyperactivity disorder)   . Back pain    spasms    Patient Active Problem List   Diagnosis Date Noted  . Elevated troponin 01/29/2015  . Polyarthralgia   . Myalgia 01/28/2015  . Right shoulder pain 01/28/2015  . Pyrexia   . Arterial hypotension   . Pain, dental   . Myelitis (HCC) 11/17/2014  . suspected Meningitis 11/17/2014  . Positive Lhermitte's sign 11/17/2014  . Fever of unknown origin (FUO)   . Febrile illness   . Notalgia     History reviewed. No pertinent surgical history.     Home Medications    Prior to Admission medications   Medication Sig Start Date End Date Taking? Authorizing Provider  acetaminophen (TYLENOL) 500 MG tablet Take 1 tablet (500 mg total) by mouth every 6 (six) hours as needed for mild pain (or Fever >/= 101). 01/31/15   Darreld Mclean, MD  Ginkgo Biloba 40 MG TABS Take 1 tablet by mouth daily.    Historical Provider, MD  HYDROmorphone (DILAUDID) 2 MG tablet Take 1 tablet (2 mg total) by mouth every 4 (four) hours as needed for severe pain. 11/19/14   Dorothea Ogle, MD  ibuprofen (ADVIL,MOTRIN) 400 MG tablet Take 1 tablet (400 mg total) by mouth every 6 (six) hours  as needed for fever or mild pain. 01/31/15   Darreld Mclean, MD  lisdexamfetamine (VYVANSE) 40 MG capsule Take 40 mg by mouth every morning.    Historical Provider, MD  Omega-3 Fatty Acids (FISH OIL PO) Take 1 capsule by mouth daily.    Historical Provider, MD  ondansetron (ZOFRAN) 4 MG tablet Take 2 tablets (8 mg total) by mouth every 4 (four) hours as needed for nausea or vomiting. 03/31/16   Eustace Moore, MD  oxyCODONE (OXY IR/ROXICODONE) 5 MG immediate release tablet Take 1 tablet (5 mg total) by mouth every 6 (six) hours as needed for moderate pain or severe pain. 01/31/15   Darreld Mclean, MD    Family History History reviewed. No pertinent family history.  Social History Social History  Substance Use Topics  . Smoking status: Former Smoker    Types: Cigarettes    Quit date: 11/16/1999  . Smokeless tobacco: Former Neurosurgeon    Quit date: 11/15/1997  . Alcohol use 0.0 oz/week     Comment: occassionally     Allergies   Penicillins    Review of Systems Review of Systems  All other systems reviewed and are negative.    Physical Exam Triage Vital Signs ED Triage Vitals  Enc Vitals Group     BP 03/31/16 1347 109/68     Pulse Rate 03/31/16 1347  89     Resp 03/31/16 1347 20     Temp 03/31/16 1347 98.1 F (36.7 C)     Temp Source 03/31/16 1347 Oral     SpO2 --      Weight --      Height --      Pain Score 03/31/16 1431 5   Updated Vital Signs BP 109/68 (BP Location: Left Arm)   Pulse 89   Temp 98.1 F (36.7 C) (Oral)   Resp 20  Physical Exam  Constitutional: He is oriented to person, place, and time. He appears distressed.  Patient is lying supine on the exam table, moaning continuously Respirations are unlabored  HENT:  Head: Atraumatic.  Eyes:  Conjugate gaze, no eye redness/drainage  Neck: Neck supple.  Cardiovascular: Normal rate and regular rhythm.   Pulmonary/Chest: No respiratory distress. He has no wheezes. He has no rales.  Lungs clear, symmetric breath  sounds No retractions observed, not splinting, not snoring  Abdominal: Soft.  Abdomen is protuberant versus slightly distended, but soft. Diffuse tenderness to light palpation. Moderate tenderness in the left lower quadrant.  Musculoskeletal: Normal range of motion.  Neurological: He is alert and oriented to person, place, and time.  Skin: Skin is warm and dry.  No cyanosis  Nursing note and vitals reviewed.    UC Treatments / Results    Procedures Procedures (including critical care time)  Medications Ordered in UC Medications  ondansetron (ZOFRAN) injection 4 mg (4 mg Intramuscular Given 03/31/16 1512)  sodium chloride 0.9 % bolus 1,000 mL (1,000 mLs Intravenous Given 03/31/16 1513)  ketorolac (TORADOL) 30 MG/ML injection 30 mg (30 mg Intravenous Given 03/31/16 1545)    Clinical Course  felt improved after 1L NS ivf, zofran and toradol.  Discharged to sip fluids, rest.  Note for work given, RTW 10/10.      Final Clinical Impressions(s) / UC Diagnoses   Final diagnoses:  Acute gastroenteritis   Sip fluids.  Anticipate improvement in nausea/vomiting/diarrhea over the next 12-24 hours.  Recheck for worsening GI symptoms, severe/unrelenting abdominal pain, new fever >100.5.  Note for work given, return to work 04/02/16.   New Prescriptions Discharge Medication List as of 03/31/2016  4:29 PM    START taking these medications   Details  ondansetron (ZOFRAN) 4 MG tablet Take 2 tablets (8 mg total) by mouth every 4 (four) hours as needed for nausea or vomiting., Starting Sun 03/31/2016, Normal         Eustace MooreLaura W Murray, MD 04/04/16 1056

## 2016-03-31 NOTE — ED Triage Notes (Signed)
Pt here for body aches, N,V,D, abd pain and back pain. Per wife, her and their kids just got over the stomach bug.

## 2019-05-03 ENCOUNTER — Other Ambulatory Visit: Payer: Self-pay

## 2019-05-03 DIAGNOSIS — Z20822 Contact with and (suspected) exposure to covid-19: Secondary | ICD-10-CM

## 2019-05-05 LAB — NOVEL CORONAVIRUS, NAA: SARS-CoV-2, NAA: NOT DETECTED

## 2022-07-19 ENCOUNTER — Emergency Department (HOSPITAL_COMMUNITY): Payer: Commercial Managed Care - PPO

## 2022-07-19 ENCOUNTER — Other Ambulatory Visit: Payer: Self-pay

## 2022-07-19 ENCOUNTER — Observation Stay (HOSPITAL_COMMUNITY): Payer: Commercial Managed Care - PPO

## 2022-07-19 ENCOUNTER — Observation Stay (HOSPITAL_COMMUNITY)
Admission: EM | Admit: 2022-07-19 | Discharge: 2022-07-21 | Disposition: A | Discharge status: Home / Self Care | Payer: Commercial Managed Care - PPO | Attending: Internal Medicine | Admitting: Internal Medicine

## 2022-07-19 ENCOUNTER — Encounter (HOSPITAL_COMMUNITY): Payer: Self-pay | Admitting: Emergency Medicine

## 2022-07-19 DIAGNOSIS — Z87891 Personal history of nicotine dependence: Secondary | ICD-10-CM | POA: Diagnosis not present

## 2022-07-19 DIAGNOSIS — J020 Streptococcal pharyngitis: Secondary | ICD-10-CM | POA: Diagnosis not present

## 2022-07-19 DIAGNOSIS — E663 Overweight: Secondary | ICD-10-CM | POA: Diagnosis present

## 2022-07-19 DIAGNOSIS — Z1152 Encounter for screening for COVID-19: Secondary | ICD-10-CM

## 2022-07-19 DIAGNOSIS — E786 Lipoprotein deficiency: Secondary | ICD-10-CM | POA: Diagnosis not present

## 2022-07-19 DIAGNOSIS — A419 Sepsis, unspecified organism: Secondary | ICD-10-CM | POA: Diagnosis not present

## 2022-07-19 DIAGNOSIS — M549 Dorsalgia, unspecified: Secondary | ICD-10-CM | POA: Diagnosis not present

## 2022-07-19 DIAGNOSIS — K760 Fatty (change of) liver, not elsewhere classified: Secondary | ICD-10-CM | POA: Diagnosis present

## 2022-07-19 DIAGNOSIS — Z79899 Other long term (current) drug therapy: Secondary | ICD-10-CM

## 2022-07-19 DIAGNOSIS — R5383 Other fatigue: Secondary | ICD-10-CM

## 2022-07-19 DIAGNOSIS — B2790 Infectious mononucleosis, unspecified without complication: Secondary | ICD-10-CM | POA: Diagnosis not present

## 2022-07-19 DIAGNOSIS — Z6827 Body mass index (BMI) 27.0-27.9, adult: Secondary | ICD-10-CM

## 2022-07-19 DIAGNOSIS — D72829 Elevated white blood cell count, unspecified: Secondary | ICD-10-CM | POA: Diagnosis not present

## 2022-07-19 DIAGNOSIS — E876 Hypokalemia: Principal | ICD-10-CM | POA: Diagnosis present

## 2022-07-19 DIAGNOSIS — Z880 Allergy status to penicillin: Secondary | ICD-10-CM

## 2022-07-19 DIAGNOSIS — J029 Acute pharyngitis, unspecified: Secondary | ICD-10-CM | POA: Diagnosis not present

## 2022-07-19 DIAGNOSIS — F909 Attention-deficit hyperactivity disorder, unspecified type: Secondary | ICD-10-CM | POA: Diagnosis present

## 2022-07-19 DIAGNOSIS — R509 Fever, unspecified: Secondary | ICD-10-CM | POA: Diagnosis present

## 2022-07-19 HISTORY — DX: Infectious mononucleosis, unspecified without complication: B27.90

## 2022-07-19 LAB — BASIC METABOLIC PANEL
Anion gap: 9 (ref 5–15)
BUN: 12 mg/dL (ref 6–20)
CO2: 21 mmol/L — ABNORMAL LOW (ref 22–32)
Calcium: 8.6 mg/dL — ABNORMAL LOW (ref 8.9–10.3)
Chloride: 105 mmol/L (ref 98–111)
Creatinine, Ser: 0.94 mg/dL (ref 0.61–1.24)
GFR, Estimated: >60 mL/min (ref >60)
Glucose, Bld: 137 mg/dL — ABNORMAL HIGH (ref 70–99)
Potassium: 3 mmol/L — ABNORMAL LOW (ref 3.5–5.1)
Sodium: 135 mmol/L (ref 135–145)

## 2022-07-19 LAB — CULTURE, BLOOD (ROUTINE X 2)

## 2022-07-19 LAB — URINALYSIS, ROUTINE W REFLEX MICROSCOPIC
Bilirubin Urine: NEGATIVE
Glucose, UA: NEGATIVE mg/dL
Hgb urine dipstick: NEGATIVE
Ketones, ur: NEGATIVE mg/dL
Leukocytes,Ua: NEGATIVE
Nitrite: NEGATIVE
Protein, ur: NEGATIVE mg/dL
Specific Gravity, Urine: 1.008 (ref 1.005–1.030)
pH: 6 (ref 5.0–8.0)

## 2022-07-19 LAB — CBC
HCT: 40.2 % (ref 39.0–52.0)
Hemoglobin: 14.3 g/dL (ref 13.0–17.0)
MCH: 30.9 pg (ref 26.0–34.0)
MCHC: 35.6 g/dL (ref 30.0–36.0)
MCV: 86.8 fL (ref 80.0–100.0)
Platelets: 187 10*3/uL (ref 150–400)
RBC: 4.63 MIL/uL (ref 4.22–5.81)
RDW: 12.8 % (ref 11.5–15.5)
WBC: 14.3 10*3/uL — ABNORMAL HIGH (ref 4.0–10.5)
nRBC: 0 % (ref 0.0–0.2)

## 2022-07-19 LAB — HEPATIC FUNCTION PANEL
ALT: 32 U/L (ref 0–44)
AST: 24 U/L (ref 15–41)
Albumin: 4.2 g/dL (ref 3.5–5.0)
Alkaline Phosphatase: 54 U/L (ref 38–126)
Bilirubin, Direct: 0.4 mg/dL — ABNORMAL HIGH (ref 0.0–0.2)
Indirect Bilirubin: 1.5 mg/dL — ABNORMAL HIGH (ref 0.3–0.9)
Total Bilirubin: 1.9 mg/dL — ABNORMAL HIGH (ref 0.3–1.2)
Total Protein: 7.5 g/dL (ref 6.5–8.1)

## 2022-07-19 LAB — RESP PANEL BY RT-PCR (RSV, FLU A&B, COVID)  RVPGX2
Influenza A by PCR: NEGATIVE
Influenza B by PCR: NEGATIVE
Resp Syncytial Virus by PCR: NEGATIVE
SARS Coronavirus 2 by RT PCR: NEGATIVE

## 2022-07-19 LAB — CBG MONITORING, ED: Glucose-Capillary: 139 mg/dL — ABNORMAL HIGH (ref 70–99)

## 2022-07-19 LAB — LACTIC ACID, PLASMA: Lactic Acid, Venous: 1.1 mmol/L (ref 0.5–1.9)

## 2022-07-19 MED ORDER — LOPERAMIDE HCL 2 MG PO CAPS
4.0000 mg | ORAL_CAPSULE | Freq: Once | ORAL | Status: AC
  Administered 2022-07-19: 4 mg via ORAL
  Filled 2022-07-19: qty 2

## 2022-07-19 MED ORDER — ONDANSETRON HCL 4 MG PO TABS: 8.0000 mg | ORAL_TABLET | ORAL | Status: DC | PRN

## 2022-07-19 MED ORDER — ZOLPIDEM TARTRATE 5 MG PO TABS
5.0000 mg | ORAL_TABLET | Freq: Every evening | ORAL | Status: DC | PRN
  Administered 2022-07-20 – 2022-07-21 (×2): 5 mg via ORAL
  Filled 2022-07-19 (×2): qty 1

## 2022-07-19 MED ORDER — SODIUM CHLORIDE 0.9 % IV SOLN
500.0000 mg | INTRAVENOUS | Status: DC
  Administered 2022-07-20: 500 mg via INTRAVENOUS
  Filled 2022-07-19 (×2): qty 5

## 2022-07-19 MED ORDER — POTASSIUM CHLORIDE 10 MEQ/100ML IV SOLN
10.0000 meq | Freq: Once | INTRAVENOUS | Status: AC
  Administered 2022-07-19: 10 meq via INTRAVENOUS
  Filled 2022-07-19: qty 100

## 2022-07-19 MED ORDER — ACETAMINOPHEN 325 MG PO TABS
650.0000 mg | ORAL_TABLET | Freq: Once | ORAL | Status: AC
  Administered 2022-07-19: 650 mg via ORAL
  Filled 2022-07-19: qty 2

## 2022-07-19 MED ORDER — ENOXAPARIN SODIUM 40 MG/0.4ML IJ SOSY
40.0000 mg | PREFILLED_SYRINGE | INTRAMUSCULAR | Status: DC
  Administered 2022-07-20 – 2022-07-21 (×2): 40 mg via SUBCUTANEOUS
  Filled 2022-07-19 (×2): qty 0.4

## 2022-07-19 MED ORDER — ACETAMINOPHEN 325 MG PO TABS: 650.0000 mg | ORAL_TABLET | Freq: Four times a day (QID) | ORAL | Status: DC | PRN

## 2022-07-19 MED ORDER — SODIUM CHLORIDE 0.9 % IV SOLN
500.0000 mg | Freq: Once | INTRAVENOUS | Status: AC
  Administered 2022-07-19: 500 mg via INTRAVENOUS
  Filled 2022-07-19: qty 5

## 2022-07-19 MED ORDER — FAMOTIDINE IN NACL 20-0.9 MG/50ML-% IV SOLN
20.0000 mg | Freq: Once | INTRAVENOUS | Status: AC
  Administered 2022-07-19: 20 mg via INTRAVENOUS
  Filled 2022-07-19: qty 50

## 2022-07-19 MED ORDER — IOHEXOL 300 MG/ML  SOLN
100.0000 mL | Freq: Once | INTRAMUSCULAR | Status: AC | PRN
  Administered 2022-07-19: 100 mL via INTRAVENOUS

## 2022-07-19 MED ORDER — DIAZEPAM 2 MG PO TABS
2.0000 mg | ORAL_TABLET | Freq: Four times a day (QID) | ORAL | Status: DC | PRN
  Administered 2022-07-20: 2 mg via ORAL
  Filled 2022-07-19: qty 1

## 2022-07-19 MED ORDER — SUMATRIPTAN SUCCINATE 50 MG PO TABS
50.0000 mg | ORAL_TABLET | ORAL | Status: DC | PRN
  Administered 2022-07-19 – 2022-07-20 (×3): 50 mg via ORAL
  Filled 2022-07-19 (×5): qty 1

## 2022-07-19 MED ORDER — POTASSIUM CHLORIDE CRYS ER 20 MEQ PO TBCR
40.0000 meq | EXTENDED_RELEASE_TABLET | Freq: Once | ORAL | Status: AC
  Administered 2022-07-19: 40 meq via ORAL
  Filled 2022-07-19: qty 2

## 2022-07-19 MED ORDER — ACETAMINOPHEN 650 MG RE SUPP: 650.0000 mg | Freq: Four times a day (QID) | RECTAL | Status: DC | PRN

## 2022-07-19 MED ORDER — SODIUM CHLORIDE 0.9 % IV SOLN: INTRAVENOUS | Status: DC

## 2022-07-19 MED ORDER — MAGNESIUM SULFATE 2 GM/50ML IV SOLN
2.0000 g | Freq: Once | INTRAVENOUS | Status: AC
  Administered 2022-07-19: 2 g via INTRAVENOUS
  Filled 2022-07-19: qty 50

## 2022-07-19 MED ORDER — KETOROLAC TROMETHAMINE 15 MG/ML IJ SOLN
15.0000 mg | Freq: Once | INTRAMUSCULAR | Status: AC
  Administered 2022-07-19: 15 mg via INTRAVENOUS
  Filled 2022-07-19: qty 1

## 2022-07-19 MED ORDER — SODIUM CHLORIDE 0.9 % IV BOLUS
2000.0000 mL | Freq: Once | INTRAVENOUS | Status: AC
  Administered 2022-07-19: 2000 mL via INTRAVENOUS

## 2022-07-19 MED ORDER — KETOROLAC TROMETHAMINE 15 MG/ML IJ SOLN
15.0000 mg | Freq: Four times a day (QID) | INTRAMUSCULAR | Status: AC
  Administered 2022-07-20 – 2022-07-21 (×5): 15 mg via INTRAVENOUS
  Filled 2022-07-19 (×5): qty 1

## 2022-07-19 NOTE — ED Triage Notes (Addendum)
Presents for HA, nausea, fever, labored breathing, weakness. Family reports patient is too weak to walk at home   Tried motrin which improved fever to 101F from 104F per family.   Was given 500cc NS by EMS which improved labored breathing.   H/o chronic mono, currently strep + per UC, started abx as directed  EMS vs: 130/78, 72bpm, 100%RA.

## 2022-07-19 NOTE — ED Notes (Signed)
Patient transported to CT 

## 2022-07-19 NOTE — ED Notes (Signed)
Admitting MD at bedside.

## 2022-07-19 NOTE — Assessment & Plan Note (Signed)
K 3.0 at admission. Due to sore throat and nausea he was given 10 meq K IV  Plan Continue Potassium supplement with PO  Bmet in AM

## 2022-07-19 NOTE — Assessment & Plan Note (Signed)
Patient c/o moderate to severe back pain. He also c/o muscle spasm. CT abd/pelvis unrevealing of cause  Plan Toradol for pain  Valium 2 mg q 6 for muscle spasm.

## 2022-07-19 NOTE — ED Notes (Signed)
Pt called out requesting "migraine" medication MD notified

## 2022-07-19 NOTE — ED Provider Notes (Signed)
[place] EMERGENCY DEPARTMENT AT [facility] Provider Note  CSN: [id number] Arrival date & time: 07/19/22 0540  Chief Complaint(s) Fever  HPI Hector Williamson is a 47 y.o. male with past medical history as below, significant for ADHD, chronic mononucleosis, arterial hypotension, back spasms who presents to the ED with complaint of fever, arthralgias, sore throat, fatigue.  Patient companied by spouse.  Similar presentation in 2016 fever unknown origin, found be secondary to mononucleosis/EBV.  Patient was recently diagnosed with strep pharyngitis, start azithromycin secondary to penicillin allergy.  He has been having sore throat, body aches, abdominal pain, poor p.o. intake, fevers, chills, nausea without vomiting.  He was seen urgent care yesterday, diagnosed with strep, COVID and flu were negative.  He was feeling better last night after fluids and analgesics urgent care but symptoms returned this morning, reported to the ER for further evaluation.  My assessment he reports diffuse bodyaches, primarily in his low back, diffuse abdominal pain, cramping, sore throat.  No dyspnea or chest pain.  No neck pain or difficulty with neck mobility.  Nausea with no vomiting.  Reduced urine output but no dysuria.  No melena or BRBPR.  Past Medical History Past Medical History:  Diagnosis Date   ADHD (attention deficit hyperactivity disorder)    Back pain    spasms   Mononucleosis    chronic   Patient Active Problem List   Diagnosis Date Noted   Elevated troponin 01/29/2015   Polyarthralgia    Myalgia 01/28/2015   Right shoulder pain 01/28/2015   Pyrexia    Arterial hypotension    Pain, dental    Myelitis (HCC) 11/17/2014   suspected Meningitis 11/17/2014   Positive Lhermitte's sign 11/17/2014   Fever of unknown origin (FUO)    Febrile illness    Notalgia    Home Medication(s) Prior to Admission medications   Medication Sig Start Date End Date Taking? Authorizing Provider   acetaminophen (TYLENOL) 500 MG tablet Take 1 tablet (500 mg total) by mouth every 6 (six) hours as needed for mild pain (or Fever >/= 101). 01/31/15   Charlsie Quest, MD  Ginkgo Biloba 40 MG TABS Take 1 tablet by mouth daily.    [provider]  HYDROmorphone (DILAUDID) 2 MG tablet Take 1 tablet (2 mg total) by mouth every 4 (four) hours as needed for severe pain. 11/19/14   Dorothea Ogle, MD  ibuprofen (ADVIL,MOTRIN) 400 MG tablet Take 1 tablet (400 mg total) by mouth every 6 (six) hours as needed for fever or mild pain. 01/31/15   Charlsie Quest, MD  lisdexamfetamine (VYVANSE) 40 MG capsule Take 40 mg by mouth every morning.    [provider]  Omega-3 Fatty Acids (FISH OIL PO) Take 1 capsule by mouth daily.    [provider]  ondansetron (ZOFRAN) 4 MG tablet Take 2 tablets (8 mg total) by mouth every 4 (four) hours as needed for nausea or vomiting. 03/31/16   Isa Rankin, MD  oxyCODONE (OXY IR/ROXICODONE) 5 MG immediate release tablet Take 1 tablet (5 mg total) by mouth every 6 (six) hours as needed for moderate pain or severe pain. 01/31/15   Charlsie Quest, MD  Past Surgical History No past surgical history on file. Family History No family history on file.  Social History Social History   Tobacco Use   Smoking status: Former    Types: Cigarettes    Quit date: 11/16/1999    Years since quitting: 22.6   Smokeless tobacco: Former    Quit date: 11/15/1997  Substance Use Topics   Alcohol use: Yes    Alcohol/week: 0.0 standard drinks of alcohol    Comment: occassionally   Drug use: No    Comment: former user: marijuana, cocaine; last used 2007   Allergies Penicillins  Review of Systems Review of Systems  Constitutional:  Positive for appetite change, chills, fatigue and fever.  HENT:  Positive for congestion and sore  throat. Negative for facial swelling and trouble swallowing.   Eyes:  Negative for photophobia and visual disturbance.  Respiratory:  Negative for cough and shortness of breath.   Cardiovascular:  Negative for chest pain and palpitations.  Gastrointestinal:  Positive for abdominal pain, diarrhea and nausea. Negative for vomiting.  Endocrine: Negative for polydipsia and polyuria.  Genitourinary:  Positive for decreased urine volume. Negative for difficulty urinating and hematuria.  Musculoskeletal:  Positive for arthralgias and back pain. Negative for gait problem, joint swelling, neck pain and neck stiffness.  Skin:  Negative for pallor and rash.  Neurological:  Negative for syncope and headaches.  Psychiatric/Behavioral:  Negative for agitation and confusion.     Physical Exam Vital Signs  I have reviewed the triage vital signs BP 126/81   Pulse 88   Temp 97.8 F (36.6 C) (Oral)   Resp 16   Wt 93 kg   SpO2 100%   BMI 27.80 kg/m  Physical Exam Vitals and nursing note reviewed.  Constitutional:      General: He is not in acute distress.    Appearance: Normal appearance. He is well-developed.  HENT:     Head: Normocephalic and atraumatic.     Jaw: There is normal jaw occlusion. No trismus.     Right Ear: External ear normal.     Left Ear: External ear normal.     Mouth/Throat:     Mouth: Mucous membranes are moist.     Pharynx: Uvula midline. Oropharyngeal exudate present.  Eyes:     General: No scleral icterus.    Extraocular Movements: Extraocular movements intact.     Conjunctiva/sclera: Conjunctivae normal.     Pupils: Pupils are equal, round, and reactive to light.  Neck:     Comments: No meningismus Cardiovascular:     Rate and Rhythm: Normal rate and regular rhythm.     Pulses: Normal pulses.     Heart sounds: Normal heart sounds.  Pulmonary:     Effort: Pulmonary effort is normal. No tachypnea, accessory muscle usage or respiratory distress.     Breath sounds:  Normal breath sounds.  Abdominal:     General: Abdomen is flat.     Palpations: Abdomen is soft.     Tenderness: There is generalized abdominal tenderness. There is no guarding or rebound.     Comments: Not peritoneal  Musculoskeletal:        General: Normal range of motion.     Cervical back: Full passive range of motion without pain, normal range of motion and neck supple.     Right lower leg: No edema.     Left lower leg: No edema.  Skin:    General: Skin is warm and dry.  Capillary Refill: Capillary refill takes less than 2 seconds.  Neurological:     Mental Status: He is alert and oriented to person, place, and time.     GCS: GCS eye subscore is 4. GCS verbal subscore is 5. GCS motor subscore is 6.     Cranial Nerves: Cranial nerves 2-12 are intact.     Sensory: Sensation is intact.     Motor: Motor function is intact.     Coordination: Coordination is intact.     Comments: Gait not tested secondary to patient safety  Psychiatric:        Mood and Affect: Mood normal.        Behavior: Behavior normal.     ED Results and Treatments Labs (all labs ordered are listed, but only abnormal results are displayed) Labs Reviewed  BASIC METABOLIC PANEL - Abnormal; Notable for the following components:      Result Value   Potassium 3.0 (*)    CO2 21 (*)    Glucose, Bld 137 (*)    Calcium 8.6 (*)    All other components within normal limits  CBC - Abnormal; Notable for the following components:   WBC 14.3 (*)    All other components within normal limits  URINALYSIS, ROUTINE W REFLEX MICROSCOPIC - Abnormal; Notable for the following components:   Color, Urine STRAW (*)    All other components within normal limits  HEPATIC FUNCTION PANEL - Abnormal; Notable for the following components:   Total Bilirubin 1.9 (*)    Bilirubin, Direct 0.4 (*)    Indirect Bilirubin 1.5 (*)    All other components within normal limits  CBG MONITORING, ED - Abnormal; Notable for the following  components:   Glucose-Capillary 139 (*)    All other components within normal limits  RESP PANEL BY RT-PCR (RSV, FLU A&B, COVID)  RVPGX2                                                                                                                          Radiology DG Chest 2 View  Result Date: 07/19/2022 CLINICAL DATA:  Headache with nausea, fever, labored breathing and weakness. EXAM: CHEST - 2 VIEW COMPARISON:  Radiographs 01/28/2015.  CT 11/18/2014. FINDINGS: The heart size and mediastinal contours are stable. The lungs are clear. There is no pleural effusion or pneumothorax. No acute osseous findings are identified. IMPRESSION: No active cardiopulmonary disease. Electronically Signed   By: Carey Bullocks M.D.   On: 07/19/2022 12:13    Pertinent labs & imaging results that were available during my care of the patient were reviewed by me and considered in my medical decision making (see MDM for details).  Medications Ordered in ED Medications  sodium chloride 0.9 % bolus 2,000 mL (2,000 mLs Intravenous New Bag/Given 07/19/22 1512)  famotidine (PEPCID) IVPB 20 mg premix (20 mg Intravenous New Bag/Given 07/19/22 1513)  ketorolac (TORADOL) 15 MG/ML injection 15 mg (15 mg Intravenous Given 07/19/22 1507)  potassium chloride SA (KLOR-CON M) CR tablet 40 mEq (40 mEq Oral Given 07/19/22 1515)  magnesium sulfate IVPB 2 g 50 mL (2 g Intravenous New Bag/Given 07/19/22 1513)                                                                                                                                     Procedures Procedures  (including critical care time)  Medical Decision Making / ED Course   MDM:  Hector Williamson is a 47 y.o. male with past medical history as below, significant for ADHD, chronic mononucleosis, arterial hypotension, back spasms who presents to the ED with complaint of fever, arthralgias, sore throat, fatigue.. The complaint involves an extensive differential diagnosis and  also carries with it a high risk of complications and morbidity.  Serious etiology was considered. Ddx includes but is not limited to: Differential diagnosis for adult fever includes but is not exclusive to community-acquired pneumonia, urinary tract infection, acute cholecystitis, viral syndrome, cellulitis, tick bourne disease,  decubitus ulcer, necrotizing fasciitis, meningitis, encephalitis, influenza, etc.  Differential diagnosis includes but is not exclusive to acute cholecystitis, intrathoracic causes for epigastric abdominal pain, gastritis, duodenitis, pancreatitis, small bowel or large bowel obstruction, abdominal aortic aneurysm, hernia, gastritis, etc.   On initial assessment the patient is: Lying in the bed, no acute distress, breathing comfortably ambient air.  aFebrile.  Abdomen soft, generalized tenderness.  Not peritoneal Vital signs and nursing notes were reviewed  Clinical Course as of 07/19/22 1621  Fri Jul 19, 2022  1532 Patient is feeling better after fluids and Toradol, Pepcid, mag.  Pending CT imaging. [SG]    Clinical Course User Index [SG] Jeanell Sparrow, DO   Patient with poor p.o. intake last 24 hours.  He was given 2 L fluids, Pepcid, mag sulfate, Toradol, potassium replacement Labs reviewed.  RVP was negative, urine negative. He does have leukocytosis and hypokalemia. Potassium was replaced orally.  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  Admission reviewed from 2016 with similar presentation.  He is currently being treated for strep pharyngitis with azithromycin.  The patient is feeling better and his CT is stable it would be reasonable to discharge him home to continue tx for strep and focus on rehydration.  Dispo pending CT and recheck. Signed out to incoming EDP   Additional history  obtained: -Additional history obtained from spouse -External records from outside source obtained and reviewed including: Chart review including previous notes, labs, imaging, consultation notes including prior admission, prior to care documentation, home medications, prior labs and imaging   Lab Tests: -I ordered, reviewed, and interpreted labs.   The pertinent results include:   Labs Reviewed  BASIC METABOLIC PANEL - Abnormal; Notable for the following components:  Result Value   Potassium 3.0 (*)    CO2 21 (*)    Glucose, Bld 137 (*)    Calcium 8.6 (*)    All other components within normal limits  CBC - Abnormal; Notable for the following components:   WBC 14.3 (*)    All other components within normal limits  URINALYSIS, ROUTINE W REFLEX MICROSCOPIC - Abnormal; Notable for the following components:   Color, Urine STRAW (*)    All other components within normal limits  HEPATIC FUNCTION PANEL - Abnormal; Notable for the following components:   Total Bilirubin 1.9 (*)    Bilirubin, Direct 0.4 (*)    Indirect Bilirubin 1.5 (*)    All other components within normal limits  CBG MONITORING, ED - Abnormal; Notable for the following components:   Glucose-Capillary 139 (*)    All other components within normal limits  RESP PANEL BY RT-PCR (RSV, FLU A&B, COVID)  RVPGX2    Notable for as above  EKG   EKG Interpretation  Date/Time:  Friday July 19 2022 06:18:13 EST Ventricular Rate:  73 PR Interval:  166 QRS Duration: 97 QT Interval:  395 QTC Calculation: 436 R Axis:   85 Text Interpretation: Sinus rhythm Baseline wander in lead(s) II aVR V1 V2 V5 V6 Confirmed by Tanda Rockers ([id number]) on 07/19/2022 2:39:52 PM         Imaging Studies ordered: I ordered imaging studies including CXR CTAP I independently visualized the following imaging with scope of interpretation limited to determining acute life threatening conditions related to emergency care: CXR neg, CTAP pend, I  agree with the radiologist interpretation   Medicines ordered and prescription drug management: Meds ordered this encounter  Medications   sodium chloride 0.9 % bolus 2,000 mL   famotidine (PEPCID) IVPB 20 mg premix   ketorolac (TORADOL) 15 MG/ML injection 15 mg   potassium chloride SA (KLOR-CON M) CR tablet 40 mEq   magnesium sulfate IVPB 2 g 50 mL    -I have reviewed the patients home medicines and have made adjustments as needed   Consultations Obtained: na   Cardiac Monitoring: The patient was maintained on a cardiac monitor.  I personally viewed and interpreted the cardiac monitored which showed an underlying rhythm of: nsr  Social Determinants of Health:  Diagnosis or treatment significantly limited by social determinants of health: former smoker   Reevaluation: After the interventions noted above, I reevaluated the patient and found that they have improved  Co morbidities that complicate the patient evaluation  Past Medical History:  Diagnosis Date   ADHD (attention deficit hyperactivity disorder)    Back pain    spasms   Mononucleosis    chronic      Dispostion: Disposition decision including need for hospitalization was considered, and patient dispo pending at shift change.    Final Clinical Impression(s) / ED Diagnoses Final diagnoses:  Hypokalemia  Other fatigue     This chart was dictated using voice recognition software.  Despite best efforts to proofread,  errors can occur which can change the documentation meaning.    Sloan Leiter, DO 07/19/22 1622

## 2022-07-19 NOTE — ED Provider Notes (Signed)
Patient turned over to me by daytime physician.  Patient diagnosed yesterday with strep pharyngitis.  But has had like a flulike illness for a number of days.  Has had 1 dose of azithromycin.  Patient has significant penicillin allergy.  Patient very weak and very fatigued.  Having persistent fevers.  Oropharynx without any significant abnormalities.  Patient's lactic acid normal COVID flu and RSV negative.  Urinalysis negative for urinary tract infection.  Patient has blood cultures pending.  White blood cell count was elevated at 14.3.  But patient does not have any true septic parameters on vital signs.  Electrolytes were significant for some hypokalemia with a potassium of 3.0.  Patient given 10 mg of potassium IV.  Since is not feeling like he could necessarily keep things down.  Patient's renal functions normal.  There were delays in getting CT scan of abdomen pelvis done.  That shows mildly dilated loops of proximal jejunum with subtle stranding no transition nothing consistent with an bowel obstruction.  Inflammatory process not completely excluded.  No other significant findings.  Chest x-ray was negative for any acute findings.  Patient has a history of some difficulties with various infections.  Will discuss with hospitalist for admission.   Fredia Sorrow, MD 07/19/22 2109

## 2022-07-19 NOTE — Assessment & Plan Note (Signed)
Patient with exudative tonsilitis mostly on the right with frank pus and tonsilar enlargement. He is very tender to palpation right neck anterior to jugular at angle of the jaw.  Plan Continue IV azithromycin  CT soft tissue neck r/o peritonsilar abscess - ENT consult if positive.

## 2022-07-19 NOTE — ED Notes (Signed)
Patient has a urine culture in the main lab 

## 2022-07-19 NOTE — Subjective & Objective (Signed)
Hector Williamson, a 47 y/o with a h/o fevers which after a complex workup in 2016 was thought to be due to chronic EBV. He has done well until he develop a fever to 104 07/17/22. On Thursday, 07/18/22 hew went to urgent care where he was diagnosed with strep throat by exam and POC testing positive for strep A. He was given 1 L IVF and IV dose of azithromycin. He was sent home with a z-pak. He presents to [facility] 07/19/22 for persistent high fever to 104, very sore throat radiating to the right neck, diffuse body aches and nausea with anorexia.

## 2022-07-19 NOTE — H&P (Signed)
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 He has done well until he develop a fever to 104 07/17/22. On Thursday, 07/18/22 hew went to urgent care where he was diagnosed with strep throat by exam and POC testing positive for strep A. He was given 1 L IVF and IV dose of azithromycin. He was sent home with a z-pak. He presents to [facility] 07/19/22 for persistent high fever to 104, very sore throat radiating to the right neck, diffuse body aches and nausea with anorexia.   ED Course: Tmax 102.1 axillary 115/75  HR 86  RR 17. EDP Exam positive for tonsilar exudate and generalized abdominal tenderness. Lab - K 3.0 glucose 137 T bili 1.9, D Bili 0.4 Lactic acid 1.1  WBC 14.3 w/ 45/54/7/5, Hgb 143, U/A negative. CT abd/pelvis notable for fatty infiltration liver and splenomegaly. In ED he was given K 10 meq IV, Azithromycin IV and 2L IV NS. TRH called to admit to continue evaluation and treatment  Review of Systems:  Review of Systems  Constitutional:  Positive for chills and fever. Negative for weight loss.  HENT:  Positive for sore throat.        Severe sore throat and pain with swallowing.  Eyes: Negative.   Respiratory: Negative.    Cardiovascular: Negative.   Gastrointestinal:  Positive for abdominal pain.       Diffuse abdominal pain  Genitourinary: Negative.   Musculoskeletal:  Positive for back pain.       Patient c/o back pain with muscle spasm  Skin: Negative.   Neurological: Negative.   Endo/Heme/Allergies: Negative.   Psychiatric/Behavioral: Negative.      Past Medical History:  Diagnosis Date   ADHD (attention deficit  hyperactivity disorder)    Back pain    spasms   Mononucleosis    chronic    History reviewed. No pertinent surgical history.   reports that he quit smoking about 22 years ago. His smoking use included cigarettes. He quit smokeless tobacco use about 24 years ago. He reports current alcohol use. He reports that he does not use drugs.  Allergies  Allergen Reactions   Penicillins Other (See Comments)    Caused a "high fever"    History reviewed. No pertinent family history.  Prior to Admission medications   Medication Sig Start Date End Date Taking? Authorizing Provider  azithromycin (ZITHROMAX) 250 MG tablet Take 500 mg by mouth daily. 07/18/22 07/21/22 Yes [provider]  Multiple Vitamin (MULTIVITAMIN) tablet Take 1 tablet by mouth daily with breakfast.   Yes [provider]  rizatriptan (MAXALT) 10 MG tablet Take 10 mg by mouth as needed (at onset of migraine and may repeat once if no relief in 2 hours- max of two doses/24 hours). May repeat in 2 hours if needed   Yes [provider]  acetaminophen (TYLENOL) 500 MG tablet Take 1 tablet (500 mg total) by mouth every 6 (six) hours as needed for mild pain (or Fever >/= 101). Patient not taking: Reported on 07/19/2022 01/31/15   Charlsie Quest, MD  HYDROmorphone (DILAUDID) 2 MG tablet Take 1  tablet (2 mg total) by mouth every 4 (four) hours as needed for severe pain. Patient not taking: Reported on 07/19/2022 11/19/14   Theodis Blaze, MD  ibuprofen (ADVIL,MOTRIN) 400 MG tablet Take 1 tablet (400 mg total) by mouth every 6 (six) hours as needed for fever or mild pain. Patient not taking: Reported on 07/19/2022 01/31/15   Lenore Cordia, MD  ondansetron (ZOFRAN) 4 MG tablet Take 2 tablets (8 mg total) by mouth every 4 (four) hours as needed for nausea or vomiting. Patient not taking: Reported on 07/19/2022 03/31/16   Wynona Luna, MD  oxyCODONE (OXY IR/ROXICODONE) 5 MG immediate release tablet Take 1 tablet (5 mg  total) by mouth every 6 (six) hours as needed for moderate pain or severe pain. Patient not taking: Reported on 07/19/2022 01/31/15   Lenore Cordia, MD    Physical Exam: Vitals:   07/19/22 2130 07/19/22 2141 07/19/22 2210 07/19/22 2230  BP: 98/61  115/75 108/69  Pulse: 85  86 82  Resp: 17  17   Temp:  (!) 102 F (38.9 C)    TempSrc:  Axillary    SpO2: 98%  97% 97%  Weight:        Physical Exam Vitals and nursing note reviewed.  Constitutional:      Appearance: Normal appearance.     Comments: overweight  HENT:     Head: Normocephalic and atraumatic.     Mouth/Throat:     Mouth: Mucous membranes are moist.     Pharynx: Oropharyngeal exudate and posterior oropharyngeal erythema present.     Comments: Enlarged tonsil more on the right with frank exudate Eyes:     Extraocular Movements: Extraocular movements intact.     Conjunctiva/sclera: Conjunctivae normal.     Pupils: Pupils are equal, round, and reactive to light.  Neck:     Comments: Very tender right anterior neck with subtle swelling Cardiovascular:     Rate and Rhythm: Normal rate and regular rhythm.     Pulses: Normal pulses.     Heart sounds: Normal heart sounds.  Pulmonary:     Effort: Pulmonary effort is normal.     Breath sounds: Normal breath sounds.  Abdominal:     Palpations: Abdomen is soft.     Tenderness: There is abdominal tenderness. There is no guarding or rebound.     Comments: Diffuse tenderness upper quadrants. No palpable HSM  Musculoskeletal:        General: Normal range of motion.     Cervical back: Neck supple. Tenderness present.  Lymphadenopathy:     Cervical: No cervical adenopathy.  Skin:    General: Skin is warm and dry.  Neurological:     General: No focal deficit present.     Mental Status: He is alert and oriented to person, place, and time.  Psychiatric:        Mood and Affect: Mood normal.        Behavior: Behavior normal.      Labs on Admission: I have personally  reviewed following labs and imaging studies  CBC: Recent Labs  Lab 07/19/22 0615  WBC 14.3*  HGB 14.3  HCT 40.2  MCV 86.8  PLT 086   Basic Metabolic Panel: Recent Labs  Lab 07/19/22 0615  NA 135  K 3.0*  CL 105  CO2 21*  GLUCOSE 137*  BUN 12  CREATININE 0.94  CALCIUM 8.6*   GFR: CrCl cannot be calculated (Unknown ideal weight.). Liver Function Tests:  Recent Labs  Lab 07/19/22 0615  AST 24  ALT 32  ALKPHOS 54  BILITOT 1.9*  PROT 7.5  ALBUMIN 4.2   No results for input(s): "LIPASE", "AMYLASE" in the last 168 hours. No results for input(s): "AMMONIA" in the last 168 hours. Coagulation Profile: No results for input(s): "INR", "PROTIME" in the last 168 hours. Cardiac Enzymes: No results for input(s): "CKTOTAL", "CKMB", "CKMBINDEX", "TROPONINI" in the last 168 hours. BNP (last 3 results) No results for input(s): "PROBNP" in the last 8760 hours. HbA1C: No results for input(s): "HGBA1C" in the last 72 hours. CBG: Recent Labs  Lab 07/19/22 0613  GLUCAP 139*   Lipid Profile: No results for input(s): "CHOL", "HDL", "LDLCALC", "TRIG", "CHOLHDL", "LDLDIRECT" in the last 72 hours. Thyroid Function Tests: No results for input(s): "TSH", "T4TOTAL", "FREET4", "T3FREE", "THYROIDAB" in the last 72 hours. Anemia Panel: No results for input(s): "VITAMINB12", "FOLATE", "FERRITIN", "TIBC", "IRON", "RETICCTPCT" in the last 72 hours. Urine analysis:    Component Value Date/Time   COLORURINE STRAW (A) 07/19/2022 0720   APPEARANCEUR CLEAR 07/19/2022 0720   LABSPEC 1.008 07/19/2022 0720   PHURINE 6.0 07/19/2022 0720   GLUCOSEU NEGATIVE 07/19/2022 0720   HGBUR NEGATIVE 07/19/2022 0720   BILIRUBINUR NEGATIVE 07/19/2022 0720   KETONESUR NEGATIVE 07/19/2022 0720   PROTEINUR NEGATIVE 07/19/2022 0720   UROBILINOGEN 0.2 01/28/2015 0055   NITRITE NEGATIVE 07/19/2022 0720   LEUKOCYTESUR NEGATIVE 07/19/2022 0720    Radiological Exams on Admission: I have personally reviewed  images CT ABDOMEN PELVIS W CONTRAST  Result Date: 07/19/2022 CLINICAL DATA:  Abdominal pain.  Body aches.  Fever EXAM: CT ABDOMEN AND PELVIS WITH CONTRAST TECHNIQUE: Multidetector CT imaging of the abdomen and pelvis was performed using the standard protocol following bolus administration of intravenous contrast. RADIATION DOSE REDUCTION: This exam was performed according to the departmental dose-optimization program which includes automated exposure control, adjustment of the mA and/or kV according to patient size and/or use of iterative reconstruction technique. CONTRAST:  OMNIPAQUE IOHEXOL 300 MG/ML  SOLN COMPARISON:  CT 01/28/2015 and older FINDINGS: Lower chest: There is some linear opacity lung bases likely scar or atelectasis. Breathing motion. No pleural effusion. Hepatobiliary: Fatty liver infiltration identified. No space-occupying liver lesion. Gallbladder is nondilated. Patent portal vein. Pancreas: Preserved pancreatic parenchyma. No enhancing mass or ductal dilatation. Spleen: Spleen is slightly enlarged with an AP diameter of 15.1 cm. Adrenals/Urinary Tract: Adrenal glands are preserved. No enhancing renal mass. Nonspecific perinephric stranding. No collecting system dilatation. Of note there is contrast in the renal collecting systems diffusely down the bladder. Please correlate for the timing of the contrast. Uterus have a normal course and caliber. Preserved contours of the urinary bladder. Stomach/Bowel: On this non oral contrast exam, the large bowel is of normal course and caliber with scattered stool. Normal appendix. This is caudal to the cecum in the right lower quadrant. Stomach is nondilated. Second portion duodenal diverticulum identified. There is some loops of proximal jejunum which are mildly dilated measuring up to 4 cm in diameter. This is relatively short-segment. No abrupt transition. Mild adjacent stranding and vascular congestion. The small bowel is nondilated.  Vascular/Lymphatic: Normal caliber aorta and IVC. No developing abnormal lymph node enlargement seen in the abdomen and pelvis. Reproductive: Prostate is unremarkable. Other: Small fat containing inguinal hernias. Small umbilical fat containing hernia. Musculoskeletal: Patient is tilted in the scanner. Mild degenerative changes along the spine and pelvis. IMPRESSION: Mildly dilated loops of proximal jejunum with some subtle stranding. No abrupt transition.  Please please correlate for any infectious, inflammatory process. Developing obstruction is felt to be less likely. Recommend follow-up Fatty liver infiltration. Mild splenomegaly. Normal appendix. Electronically Signed   By: Jill Side M.D.   On: 07/19/2022 19:23   DG Chest 2 View  Result Date: 07/19/2022 CLINICAL DATA:  Headache with nausea, fever, labored breathing and weakness. EXAM: CHEST - 2 VIEW COMPARISON:  Radiographs 01/28/2015.  CT 11/18/2014. FINDINGS: The heart size and mediastinal contours are stable. The lungs are clear. There is no pleural effusion or pneumothorax. No acute osseous findings are identified. IMPRESSION: No active cardiopulmonary disease. Electronically Signed   By: Richardean Sale M.D.   On: 07/19/2022 12:13    EKG: I have personally reviewed EKG: no EKG  Assessment/Plan Principal Problem:   Strep throat Active Problems:   Hypokalemia   Back pain    Assessment and Plan: * Strep throat Patient with exudative tonsilitis mostly on the right with frank pus and tonsilar enlargement. He is very tender to palpation right neck anterior to jugular at angle of the jaw.  Plan Continue IV azithromycin  CT soft tissue neck r/o peritonsilar abscess - ENT consult if positive.  Back pain Patient c/o moderate to severe back pain. He also c/o muscle spasm. CT abd/pelvis unrevealing of cause  Plan Toradol for pain  Valium 2 mg q 6 for muscle spasm.  Hypokalemia K 3.0 at admission. Due to sore throat and nausea he was  given 10 meq K IV  Plan Continue Potassium supplement with PO  Bmet in AM       DVT prophylaxis: Lovenox Code Status: Full Code Family Communication: left messasge for julia Cox, spouse  Disposition Plan: home when medically stable  Consults called: none  Admission status: Observation, Med-Surg   Adella Hare, MD [facility] 07/19/2022, 11:08 PM

## 2022-07-20 ENCOUNTER — Encounter (HOSPITAL_COMMUNITY): Payer: Self-pay | Admitting: Internal Medicine

## 2022-07-20 DIAGNOSIS — M549 Dorsalgia, unspecified: Secondary | ICD-10-CM | POA: Diagnosis present

## 2022-07-20 DIAGNOSIS — E876 Hypokalemia: Secondary | ICD-10-CM | POA: Diagnosis present

## 2022-07-20 DIAGNOSIS — K760 Fatty (change of) liver, not elsewhere classified: Secondary | ICD-10-CM | POA: Diagnosis present

## 2022-07-20 DIAGNOSIS — Z6827 Body mass index (BMI) 27.0-27.9, adult: Secondary | ICD-10-CM | POA: Diagnosis not present

## 2022-07-20 DIAGNOSIS — Z79899 Other long term (current) drug therapy: Secondary | ICD-10-CM | POA: Diagnosis not present

## 2022-07-20 DIAGNOSIS — J029 Acute pharyngitis, unspecified: Secondary | ICD-10-CM | POA: Diagnosis present

## 2022-07-20 DIAGNOSIS — J020 Streptococcal pharyngitis: Secondary | ICD-10-CM

## 2022-07-20 DIAGNOSIS — A419 Sepsis, unspecified organism: Secondary | ICD-10-CM | POA: Diagnosis present

## 2022-07-20 DIAGNOSIS — F909 Attention-deficit hyperactivity disorder, unspecified type: Secondary | ICD-10-CM | POA: Diagnosis present

## 2022-07-20 DIAGNOSIS — Z87891 Personal history of nicotine dependence: Secondary | ICD-10-CM | POA: Diagnosis not present

## 2022-07-20 DIAGNOSIS — E663 Overweight: Secondary | ICD-10-CM | POA: Diagnosis present

## 2022-07-20 DIAGNOSIS — Z1152 Encounter for screening for COVID-19: Secondary | ICD-10-CM | POA: Diagnosis not present

## 2022-07-20 DIAGNOSIS — B2790 Infectious mononucleosis, unspecified without complication: Secondary | ICD-10-CM | POA: Diagnosis present

## 2022-07-20 DIAGNOSIS — Z880 Allergy status to penicillin: Secondary | ICD-10-CM | POA: Diagnosis not present

## 2022-07-20 LAB — BASIC METABOLIC PANEL
Anion gap: 5 (ref 5–15)
BUN: 9 mg/dL (ref 6–20)
CO2: 23 mmol/L (ref 22–32)
Calcium: 7.4 mg/dL — ABNORMAL LOW (ref 8.9–10.3)
Chloride: 114 mmol/L — ABNORMAL HIGH (ref 98–111)
Creatinine, Ser: 0.84 mg/dL (ref 0.61–1.24)
GFR, Estimated: >60 mL/min (ref >60)
Glucose, Bld: 90 mg/dL (ref 70–99)
Potassium: 3.5 mmol/L (ref 3.5–5.1)
Sodium: 142 mmol/L (ref 135–145)

## 2022-07-20 LAB — HIV ANTIBODY (ROUTINE TESTING W REFLEX): HIV Screen 4th Generation wRfx: NONREACTIVE

## 2022-07-20 MED ORDER — SODIUM CHLORIDE 0.9 % IV SOLN
2.0000 g | Freq: Every day | INTRAVENOUS | Status: DC
  Administered 2022-07-20 – 2022-07-21 (×2): 2 g via INTRAVENOUS
  Filled 2022-07-20 (×2): qty 20

## 2022-07-20 MED ORDER — GUAIFENESIN-DM 100-10 MG/5ML PO SYRP: 5.0000 mL | ORAL_SOLUTION | ORAL | Status: DC | PRN

## 2022-07-20 MED ORDER — ONDANSETRON HCL 4 MG/2ML IJ SOLN: 4.0000 mg | Freq: Four times a day (QID) | INTRAMUSCULAR | Status: DC | PRN

## 2022-07-20 NOTE — Progress Notes (Signed)
[facility] Progress Note Patient: Hector Williamson WNI:[id number] DOB: September 04, 1975 DOA: 07/19/2022  DOS: the patient was seen and examined on 07/20/2022  Brief hospital course: PMH of ADHD not on any medication anymore presented to the hospital with complaints of throat pain, generalized weakness, bilateral chest pain muscle spasm found to have strep throat infection with sepsis. Assessment and Plan: Sepsis secondary to strep throat pharyngitis/tonsillitis. Presents with complaints of cough sore throat and cough. Met SIRS criteria on admission with leukocytosis and fever. Had some abdominal symptoms as well as recently positive strep throat. Initially was on IV azithromycin.  I added IV ceftriaxone. CT of the soft tissue neck does not show any evidence of retropharyngeal abscess. Monitor overnight for stability.  Jejunal inflammation. CT scan shows evidence of jejunal dilation and inflammation. Patient did have some GI symptoms. Ceftriaxone added. Monitor blood  ADHD. Was on Vyvanse currently not on medication right now. Monitor.  Hypokalemia. Corrected.  Hyperbilirubinemia In the setting of sepsis. Monitor.  Subjective: Feeling better.  Still has some pain aphasia.  Also sore throat.  No nausea no vomiting.  No abdominal pain.  No diarrhea.  Physical Exam: General: in Mild distress, No Rash, possibly related to x-ray finding, significant erythema in the pharynx, no stridor Cardiovascular: S1 and S2 Present, No Murmur Respiratory: Good respiratory effort, Bilateral Air entry present. No Crackles, No wheezes Abdomen: Bowel Sound present, No tenderness Extremities: No edema Neuro: Alert and oriented x3, no new focal deficit  Data Reviewed: I have Reviewed nursing notes, Vitals, and Lab results. Since last encounter, pertinent lab results CBC and BMP   . I have ordered test including CBC and BMP  .   Disposition: Status is: Inpatient Remains inpatient appropriate  because: Continue IV antibiotics  enoxaparin (LOVENOX) injection 40 mg Start: 07/20/22 1000   Family Communication: Wife at bedside Level of care: Med-Surg  Vitals:   07/20/22 1707 07/20/22 1709 07/20/22 1749 07/20/22 1802  BP: 105/75   119/70  Pulse: 76   63  Resp: 18   17  Temp: 99 F (37.2 C)  99 F (37.2 C) 97.7 F (36.5 C)  TempSrc:   Oral Oral  SpO2:    99%  Weight:  93 kg    Height:  6' (1.829 m)       Author: Berle Mull, MD 07/20/2022 7:47 PM  Please look on [email or web address] to find out who is on call.

## 2022-07-20 NOTE — Hospital Course (Signed)
PMH of ADHD not on any medication anymore presented to the hospital with complaints of throat pain, generalized weakness, bilateral chest pain muscle spasm found to have strep throat infection with sepsis.

## 2022-07-21 DIAGNOSIS — E876 Hypokalemia: Secondary | ICD-10-CM | POA: Diagnosis not present

## 2022-07-21 DIAGNOSIS — M549 Dorsalgia, unspecified: Secondary | ICD-10-CM | POA: Diagnosis not present

## 2022-07-21 DIAGNOSIS — J020 Streptococcal pharyngitis: Secondary | ICD-10-CM | POA: Diagnosis not present

## 2022-07-21 LAB — CULTURE, BLOOD (ROUTINE X 2)

## 2022-07-21 MED ORDER — CEFPODOXIME PROXETIL 200 MG PO TABS: 200.0000 mg | ORAL_TABLET | Freq: Two times a day (BID) | ORAL | 0 refills | Status: AC

## 2022-07-21 MED ORDER — BENZONATATE 100 MG PO CAPS: 100.0000 mg | ORAL_CAPSULE | Freq: Three times a day (TID) | ORAL | 0 refills | Status: AC | PRN

## 2022-07-21 MED ORDER — AZITHROMYCIN 250 MG PO TABS: 500.0000 mg | ORAL_TABLET | Freq: Every day | ORAL | 0 refills | Status: AC

## 2022-07-21 MED ORDER — DM-GUAIFENESIN ER 30-600 MG PO TB12: 1.0000 | ORAL_TABLET | Freq: Two times a day (BID) | ORAL | 0 refills | Status: AC

## 2022-07-21 MED ORDER — AZITHROMYCIN 500 MG PO TABS: 500.0000 mg | ORAL_TABLET | Freq: Every day | ORAL | 0 refills | Status: AC

## 2022-07-21 MED ORDER — NAPROXEN 500 MG PO TBEC: 500.0000 mg | DELAYED_RELEASE_TABLET | Freq: Two times a day (BID) | ORAL | 0 refills | Status: AC

## 2022-07-21 NOTE — Plan of Care (Signed)
  Problem: Education: Goal: Knowledge of General Education information will improve Description: Including pain rating scale, medication(s)/side effects and non-pharmacologic comfort measures Outcome: Progressing   Problem: Health Behavior/Discharge Planning: Goal: Ability to manage health-related needs will improve Outcome: Progressing   Problem: Clinical Measurements: Goal: Ability to maintain clinical measurements within normal limits will improve Outcome: Progressing Goal: Diagnostic test results will improve Outcome: Progressing   Problem: Activity: Goal: Risk for activity intolerance will decrease Outcome: Progressing   Problem: Nutrition: Goal: Adequate nutrition will be maintained Outcome: Progressing   Problem: Coping: Goal: Level of anxiety will decrease Outcome: Progressing   Problem: Pain Managment: Goal: General experience of comfort will improve Outcome: Progressing   Problem: Safety: Goal: Ability to remain free from injury will improve Outcome: Progressing   

## 2022-07-21 NOTE — Progress Notes (Signed)
Dc order recd. IV dc per protocol, pt tol well. DC instructions given to pt who voices understanding of follow up responsibilities and medications. Pt voices no concerns or c/o. No distress noted. Pt travels by wheelchair to main entrance accompanied by spouse and will return home via pov. All belongings accompany pt.

## 2022-07-22 LAB — CULTURE, BLOOD (ROUTINE X 2): Culture: NO GROWTH

## 2022-07-22 NOTE — Discharge Summary (Signed)
Physician Discharge Summary   Patient: Hector Williamson MRN: [id number] DOB: 01/05/1976  Admit date:     07/19/2022  Discharge date: 07/21/2022  Discharge Physician: Lynden Oxford  PCP: Ollen Bowl, MD  Recommendations at discharge: Follow-up with PCP.   Follow-up Information     Pahwani, Kasandra Knudsen, MD. Call .   Specialty: Internal Medicine Why: Follow up from ER visit Contact information: [address] [place] [address] [phone]         [facility] Emergency Department at [facility]. Go to .   Specialty: Emergency Medicine Why: As needed, If symptoms worsen Contact information: [address] [id number] [address] [place] [address] [phone]               Discharge Diagnoses: Principal Problem:   Strep throat Active Problems:   Hypokalemia   Back pain   Acute pharyngitis  Hospital Course: PMH of ADHD not on any medication anymore presented to the hospital with complaints of throat pain, generalized weakness, bilateral chest pain muscle spasm found to have strep throat infection with sepsis.  Assessment and Plan  Sepsis secondary to strep throat pharyngitis/tonsillitis. Presents with complaints of cough sore throat and cough. Met SIRS criteria on admission with leukocytosis and fever. Had some abdominal symptoms as well as recently positive strep throat. Initially was on IV azithromycin.  I added IV ceftriaxone. CT of the soft tissue neck does not show any evidence of retropharyngeal abscess. Outpatient follow-up with PCP.   Jejunal inflammation. CT scan shows evidence of jejunal dilation and inflammation. Patient did have some GI symptoms. Ceftriaxone added.  Switch to oral Vantin.   ADHD. Was on Vyvanse currently not on medication right now. Monitor.   Hypokalemia. Corrected.   Hyperbilirubinemia In the setting of sepsis.  Consultants:  none  Procedures performed:  none  DISCHARGE  MEDICATION: Allergies as of 07/21/2022       Reactions   Penicillins Other (See Comments)   Caused a "high fever"        Medication List     STOP taking these medications    HYDROmorphone 2 MG tablet Commonly known as: Dilaudid   ibuprofen 400 MG tablet Commonly known as: ADVIL   ondansetron 4 MG tablet Commonly known as: ZOFRAN   oxyCODONE 5 MG immediate release tablet Commonly known as: Oxy IR/ROXICODONE       TAKE these medications    acetaminophen 500 MG tablet Commonly known as: TYLENOL Take 1 tablet (500 mg total) by mouth every 6 (six) hours as needed for mild pain (or Fever >/= 101).   azithromycin 250 MG tablet Commonly known as: ZITHROMAX Take 2 tablets (500 mg total) by mouth daily for 1 day. Finish the course that you have and start new prescription after that. What changed: additional instructions   azithromycin 500 MG tablet Commonly known as: Zithromax Take 1 tablet (500 mg total) by mouth daily for 3 days. Take 1 tablet daily for 3 days. Start taking on: July 23, 2022 What changed: You were already taking a medication with the same name, and this prescription was added. Make sure you understand how and when to take each.   benzonatate 100 MG capsule Commonly known as: Tessalon Perles Take 1 capsule (100 mg total) by mouth 3 (three) times daily as needed for cough.   cefpodoxime 200 MG tablet Commonly known as: VANTIN Take 1 tablet (200 mg total) by mouth 2 (two) times daily for 5  days.   dextromethorphan-guaiFENesin 30-600 MG 12hr tablet Commonly known as: MUCINEX DM Take 1 tablet by mouth 2 (two) times daily for 7 days.   multivitamin tablet Take 1 tablet by mouth daily with breakfast.   naproxen 500 MG EC tablet Commonly known as: EC NAPROSYN Take 1 tablet (500 mg total) by mouth 2 (two) times daily with a meal for 3 days.   rizatriptan 10 MG tablet Commonly known as: MAXALT Take 10 mg by mouth as needed (at onset of migraine and  may repeat once if no relief in 2 hours- max of two doses/24 hours). May repeat in 2 hours if needed       Disposition: Home Diet recommendation: Regular diet  Discharge Exam: Vitals:   07/20/22 1802 07/20/22 2144 07/21/22 0154 07/21/22 0608  BP: 119/70 107/72 109/71 113/72  Pulse: 63 64 72 67  Resp: 17 18 16 16   Temp: 97.7 F (36.5 C) 98.5 F (36.9 C) 98.6 F (37 C) 99.1 F (37.3 C)  TempSrc: Oral Oral Oral Oral  SpO2: 99% 100% 97% 97%  Weight:      Height:       General: Appear in mild distress; no visible Abnormal Neck Mass Or lumps, Conjunctiva normal, erythema noted on uvula and pharynx Cardiovascular: S1 and S2 Present, no Murmur, Respiratory: increased respiratory effort, Bilateral Air entry present and CTA, no Crackles, no wheezes, no stridor Abdomen: Bowel Sound present, Non tender  Extremities: no Pedal edema Neurology: alert and oriented to time, place, and person  Filed Weights   07/19/22 0602 07/20/22 1709  Weight: 93 kg 93 kg   Condition at discharge: stable  The results of significant diagnostics from this hospitalization (including imaging, microbiology, ancillary and laboratory) are listed below for reference.   Imaging Studies: CT SOFT TISSUE NECK WO CONTRAST  Result Date: 07/20/2022 CLINICAL DATA:  Initial evaluation for tonsil/adenoid disorder. EXAM: CT NECK WITHOUT CONTRAST TECHNIQUE: Multidetector CT imaging of the neck was performed following the standard protocol without intravenous contrast. RADIATION DOSE REDUCTION: This exam was performed according to the departmental dose-optimization program which includes automated exposure control, adjustment of the mA and/or kV according to patient size and/or use of iterative reconstruction technique. COMPARISON:  None Available. FINDINGS: Pharynx and larynx: Oral cavity grossly within normal limits, although evaluation limited by streak artifact from dental amalgam. Palatine tonsils are enlarged and somewhat  edematous, suggesting acute tonsillitis. No visible discrete tonsillar or peritonsillar abscess allowing for streak artifact and lack of IV contrast. Associated mild mucosal edema within the adjacent oropharynx. Nasopharynx within normal limits. No retropharyngeal collection. Negative epiglottis. Hypopharynx and supraglottic larynx within normal limits. Negative glottis. Subglottic airway clear. Salivary glands: Salivary glands including the parotid and submandibular glands are within normal limits. Thyroid: Normal. Lymph nodes: Mildly prominent upper cervical lymph nodes, most prominent of which measures 1.2 cm at left level 2. No other enlarged or pathologic adenopathy. Vascular: Not well assessed given lack of IV contrast. Limited intracranial: Unremarkable. Visualized orbits: Unremarkable. Mastoids and visualized paranasal sinuses: Mild scattered mucosal thickening present about the ethmoidal air cells and maxillary sinuses. Mastoid air cells and middle ear cavities are clear. Skeleton: No discrete or worrisome osseous lesions. Mild spondylosis noted at C5-6 and C6-7. Upper chest: Unremarkable. Other: None. IMPRESSION: 1. Findings suggestive of acute tonsillitis. No visible discrete tonsillar or peritonsillar abscess allowing for streak artifact and lack of IV contrast. 2. Mildly prominent upper cervical lymph nodes, presumably reactive. Electronically Signed   By: Marland Kitchen  Jeannine Boga M.D.   On: 07/20/2022 00:08   CT ABDOMEN PELVIS W CONTRAST  Result Date: 07/19/2022 CLINICAL DATA:  Abdominal pain.  Body aches.  Fever EXAM: CT ABDOMEN AND PELVIS WITH CONTRAST TECHNIQUE: Multidetector CT imaging of the abdomen and pelvis was performed using the standard protocol following bolus administration of intravenous contrast. RADIATION DOSE REDUCTION: This exam was performed according to the departmental dose-optimization program which includes automated exposure control, adjustment of the mA and/or kV according to  patient size and/or use of iterative reconstruction technique. CONTRAST:  164mL OMNIPAQUE IOHEXOL 300 MG/ML  SOLN COMPARISON:  CT 01/28/2015 and older FINDINGS: Lower chest: There is some linear opacity lung bases likely scar or atelectasis. Breathing motion. No pleural effusion. Hepatobiliary: Fatty liver infiltration identified. No space-occupying liver lesion. Gallbladder is nondilated. Patent portal vein. Pancreas: Preserved pancreatic parenchyma. No enhancing mass or ductal dilatation. Spleen: Spleen is slightly enlarged with an AP diameter of 15.1 cm. Adrenals/Urinary Tract: Adrenal glands are preserved. No enhancing renal mass. Nonspecific perinephric stranding. No collecting system dilatation. Of note there is contrast in the renal collecting systems diffusely down the bladder. Please correlate for the timing of the contrast. Uterus have a normal course and caliber. Preserved contours of the urinary bladder. Stomach/Bowel: On this non oral contrast exam, the large bowel is of normal course and caliber with scattered stool. Normal appendix. This is caudal to the cecum in the right lower quadrant. Stomach is nondilated. Second portion duodenal diverticulum identified. There is some loops of proximal jejunum which are mildly dilated measuring up to 4 cm in diameter. This is relatively short-segment. No abrupt transition. Mild adjacent stranding and vascular congestion. The small bowel is nondilated. Vascular/Lymphatic: Normal caliber aorta and IVC. No developing abnormal lymph node enlargement seen in the abdomen and pelvis. Reproductive: Prostate is unremarkable. Other: Small fat containing inguinal hernias. Small umbilical fat containing hernia. Musculoskeletal: Patient is tilted in the scanner. Mild degenerative changes along the spine and pelvis. IMPRESSION: Mildly dilated loops of proximal jejunum with some subtle stranding. No abrupt transition. Please please correlate for any infectious, inflammatory  process. Developing obstruction is felt to be less likely. Recommend follow-up Fatty liver infiltration. Mild splenomegaly. Normal appendix. Electronically Signed   By: Jill Side M.D.   On: 07/19/2022 19:23   DG Chest 2 View  Result Date: 07/19/2022 CLINICAL DATA:  Headache with nausea, fever, labored breathing and weakness. EXAM: CHEST - 2 VIEW COMPARISON:  Radiographs 01/28/2015.  CT 11/18/2014. FINDINGS: The heart size and mediastinal contours are stable. The lungs are clear. There is no pleural effusion or pneumothorax. No acute osseous findings are identified. IMPRESSION: No active cardiopulmonary disease. Electronically Signed   By: Richardean Sale M.D.   On: 07/19/2022 12:13    Microbiology: Results for orders placed or performed during the hospital encounter of 07/19/22  Resp panel by RT-PCR (RSV, Flu A&B, Covid) Anterior Nasal Swab     Status: None   Collection Time: 07/19/22 12:30 PM   Specimen: Anterior Nasal Swab  Result Value Ref Range Status   SARS Coronavirus 2 by RT PCR NEGATIVE NEGATIVE Final    Comment: (NOTE) SARS-CoV-2 target nucleic acids are NOT DETECTED.  The SARS-CoV-2 RNA is generally detectable in upper respiratory specimens during the acute phase of infection. The lowest concentration of SARS-CoV-2 viral copies this assay can detect is 138 copies/mL. A negative result does not preclude SARS-Cov-2 infection and should not be used as the sole basis for treatment or other patient management  decisions. A negative result may occur with  improper specimen collection/handling, submission of specimen other than nasopharyngeal swab, presence of viral mutation(s) within the areas targeted by this assay, and inadequate number of viral copies(<138 copies/mL). A negative result must be combined with clinical observations, patient history, and epidemiological information. The expected result is Negative.  Fact Sheet for Patients:   BloggerCourse.com  Fact Sheet for Healthcare Providers:  SeriousBroker.it  This test is no t yet approved or cleared by the [place] FDA and  has been authorized for detection and/or diagnosis of SARS-CoV-2 by FDA under an Emergency Use Authorization (EUA). This EUA will remain  in effect (meaning this test can be used) for the duration of the COVID-19 declaration under Section 564(b)(1) of the Act, 21 U.S.C.section 360bbb-3(b)(1), unless the authorization is terminated  or revoked sooner.       Influenza A by PCR NEGATIVE NEGATIVE Final   Influenza B by PCR NEGATIVE NEGATIVE Final    Comment: (NOTE) The Xpert Xpress SARS-CoV-2/FLU/RSV plus assay is intended as an aid in the diagnosis of influenza from Nasopharyngeal swab specimens and should not be used as a sole basis for treatment. Nasal washings and aspirates are unacceptable for Xpert Xpress SARS-CoV-2/FLU/RSV testing.  Fact Sheet for Patients: BloggerCourse.com  Fact Sheet for Healthcare Providers: SeriousBroker.it  This test is not yet approved or cleared by the [place] FDA and has been authorized for detection and/or diagnosis of SARS-CoV-2 by FDA under an Emergency Use Authorization (EUA). This EUA will remain in effect (meaning this test can be used) for the duration of the COVID-19 declaration under Section 564(b)(1) of the Act, 21 U.S.C. section 360bbb-3(b)(1), unless the authorization is terminated or revoked.     Resp Syncytial Virus by PCR NEGATIVE NEGATIVE Final    Comment: (NOTE) Fact Sheet for Patients: BloggerCourse.com  Fact Sheet for Healthcare Providers: SeriousBroker.it  This test is not yet approved or cleared by the [place] FDA and has been authorized for detection and/or diagnosis of SARS-CoV-2 by FDA under an Emergency Use  Authorization (EUA). This EUA will remain in effect (meaning this test can be used) for the duration of the COVID-19 declaration under Section 564(b)(1) of the Act, 21 U.S.C. section 360bbb-3(b)(1), unless the authorization is terminated or revoked.  Performed at [facility], [address], [place], [place] [address]   Culture, blood (Routine X 2) w Reflex to ID Panel     Status: None (Preliminary result)   Collection Time: 07/19/22  6:00 PM   Specimen: BLOOD  Result Value Ref Range Status   Specimen Description   Final    BLOOD SITE NOT SPECIFIED Performed at [facility], [address], [place], [place] [address]    Special Requests   Final    BOTTLES DRAWN AEROBIC AND ANAEROBIC Blood Culture adequate volume Performed at [facility], [address] [place], [place] [address]    Culture   Final    NO GROWTH 3 DAYS Performed at [facility], [address] [place], [place] [address]    Report Status PENDING  Incomplete  Culture, blood (Routine X 2) w Reflex to ID Panel     Status: None (Preliminary result)   Collection Time: 07/19/22  6:10 PM   Specimen: BLOOD RIGHT FOREARM  Result Value Ref Range Status   Specimen Description   Final    BLOOD RIGHT FOREARM Performed at [facility], [address]  [place], [place] [address]    Special Requests   Final    BOTTLES DRAWN AEROBIC AND ANAEROBIC Blood Culture results may not be optimal due to an excessive volume of blood received in culture bottles Performed at [facility], [address], [place], [place] [address]    Culture   Final    NO GROWTH 3 DAYS Performed at [facility], [address], [place], [place] [address]    Report Status PENDING  Incomplete   Labs: CBC: Recent Labs  Lab 07/19/22 0615  WBC 14.3*  HGB 14.3  HCT 40.2  MCV 86.8  PLT 187   Basic Metabolic Panel: Recent Labs  Lab 07/19/22 0615  07/20/22 0415  NA 135 142  K 3.0* 3.5  CL 105 114*  CO2 21* 23  GLUCOSE 137* 90  BUN 12 9  CREATININE 0.94 0.84  CALCIUM 8.6* 7.4*   Liver Function Tests: Recent Labs  Lab 07/19/22 0615  AST 24  ALT 32  ALKPHOS 54  BILITOT 1.9*  PROT 7.5  ALBUMIN 4.2   CBG: Recent Labs  Lab 07/19/22 0613  GLUCAP 139*    Discharge time spent: greater than 30 minutes.  Signed: Lynden Oxford, MD [facility] 07/21/2022

## 2022-07-23 LAB — CULTURE, BLOOD (ROUTINE X 2)
Culture: NO GROWTH
Special Requests: ADEQUATE

## 2022-07-24 LAB — CULTURE, BLOOD (ROUTINE X 2)

## 2023-07-09 DIAGNOSIS — M25562 Pain in left knee: Secondary | ICD-10-CM | POA: Diagnosis not present

## 2023-09-10 DIAGNOSIS — M1712 Unilateral primary osteoarthritis, left knee: Secondary | ICD-10-CM | POA: Diagnosis not present

## 2023-10-20 DIAGNOSIS — M1712 Unilateral primary osteoarthritis, left knee: Secondary | ICD-10-CM | POA: Diagnosis not present

## 2023-10-27 DIAGNOSIS — M1712 Unilateral primary osteoarthritis, left knee: Secondary | ICD-10-CM | POA: Diagnosis not present

## 2023-11-03 DIAGNOSIS — M1712 Unilateral primary osteoarthritis, left knee: Secondary | ICD-10-CM | POA: Diagnosis not present

## 2023-11-21 DIAGNOSIS — F909 Attention-deficit hyperactivity disorder, unspecified type: Secondary | ICD-10-CM | POA: Diagnosis not present

## 2023-11-21 DIAGNOSIS — G43009 Migraine without aura, not intractable, without status migrainosus: Secondary | ICD-10-CM | POA: Diagnosis not present

## 2023-12-12 DIAGNOSIS — Z0000 Encounter for general adult medical examination without abnormal findings: Secondary | ICD-10-CM | POA: Diagnosis not present

## 2023-12-12 DIAGNOSIS — F901 Attention-deficit hyperactivity disorder, predominantly hyperactive type: Secondary | ICD-10-CM | POA: Diagnosis not present

## 2023-12-12 DIAGNOSIS — G43009 Migraine without aura, not intractable, without status migrainosus: Secondary | ICD-10-CM | POA: Diagnosis not present

## 2023-12-12 DIAGNOSIS — E781 Pure hyperglyceridemia: Secondary | ICD-10-CM | POA: Diagnosis not present

## 2024-02-19 DIAGNOSIS — M25521 Pain in right elbow: Secondary | ICD-10-CM | POA: Diagnosis not present

## 2024-03-12 DIAGNOSIS — M25521 Pain in right elbow: Secondary | ICD-10-CM | POA: Diagnosis not present

## 2024-03-19 DIAGNOSIS — M25521 Pain in right elbow: Secondary | ICD-10-CM | POA: Diagnosis not present
